# Patient Record
Sex: Female | Born: 2000 | Race: Black or African American | Hispanic: Yes | Marital: Single | Smoking: Never smoker
Health system: Southern US, Community
[De-identification: ages and names within clinical notes are randomized; demographics above are authoritative.]

## PROBLEM LIST (undated history)

## (undated) DIAGNOSIS — F419 Anxiety disorder, unspecified: Secondary | ICD-10-CM

## (undated) DIAGNOSIS — J45909 Unspecified asthma, uncomplicated: Secondary | ICD-10-CM

## (undated) DIAGNOSIS — Z789 Other specified health status: Secondary | ICD-10-CM

## (undated) DIAGNOSIS — Z7289 Other problems related to lifestyle: Secondary | ICD-10-CM

## (undated) DIAGNOSIS — F609 Personality disorder, unspecified: Secondary | ICD-10-CM

## (undated) HISTORY — DX: Personality disorder, unspecified: F60.9

## (undated) HISTORY — DX: Unspecified asthma, uncomplicated: J45.909

## (undated) HISTORY — PX: NO PAST SURGERIES: SHX2092

## (undated) HISTORY — DX: Anxiety disorder, unspecified: F41.9

---
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
## 2022-01-29 NOTE — L&D Delivery Note (Signed)
OB/GYN Faculty Practice Delivery Note  Stephanie Gamble is a 22 y.o. G2P1001 s/p SVD at [redacted]w[redacted]d. She was admitted for active labor.   ROM: 0h 69m with light/mod MSF fluid GBS Status: pos Maximum Maternal Temperature: 97.9  Labor Progress: Stephanie Gamble was admitted in early active labor; she got comfortable w an epidural and had the second dose of PCN hung as her membranes were ruptured for light/mod MSF. She felt the urge to push soon after and was complete, pushing with two contractions to SVD.  Delivery Date/Time: November 20th, 2024 at 2033 Delivery: Called to room and patient was complete and pushing. Head delivered ROA. No nuchal cord present. Shoulder and body delivered in usual fashion. Infant with spontaneous cry, placed on mother's abdomen, dried and stimulated. Cord clamped x 2 after 1-minute delay, and cut by FOB. Cord blood drawn. Placenta delivered spontaneously with gentle cord traction. Fundus firm with massage and Pitocin. Labia, perineum, vagina, and cervix inspected and found to have a L labial tear.   Placenta: spont, intact; to L&D Complications: none Lacerations: L labial, repaired with 4-0 Monocryl EBL: 501cc Analgesia: epidural  Postpartum Planning [x]  message to sent to schedule follow-up including request for routine Pap [x]  urine culture sent (TOC for recent UTI) - Wants inpatient Nexplanon placed prior to d/c  Infant: girl  APGARs 8/9  3062g (6lb 12oz)  Stephanie Gamble, CNM  12/19/2022 9:15 PM

## 2022-02-07 ENCOUNTER — Emergency Department (HOSPITAL_COMMUNITY): Payer: Medicaid Other

## 2022-02-07 ENCOUNTER — Emergency Department (HOSPITAL_COMMUNITY)
Admission: EM | Admit: 2022-02-07 | Discharge: 2022-02-08 | Disposition: A | Discharge status: Home / Self Care | Payer: Medicaid Other | Attending: Emergency Medicine | Admitting: Emergency Medicine

## 2022-02-07 ENCOUNTER — Other Ambulatory Visit: Payer: Self-pay

## 2022-02-07 ENCOUNTER — Encounter (HOSPITAL_COMMUNITY): Payer: Self-pay

## 2022-02-07 DIAGNOSIS — R1030 Lower abdominal pain, unspecified: Secondary | ICD-10-CM | POA: Diagnosis present

## 2022-02-07 DIAGNOSIS — Z20822 Contact with and (suspected) exposure to covid-19: Secondary | ICD-10-CM | POA: Diagnosis not present

## 2022-02-07 DIAGNOSIS — N9489 Other specified conditions associated with female genital organs and menstrual cycle: Secondary | ICD-10-CM | POA: Diagnosis not present

## 2022-02-07 DIAGNOSIS — N12 Tubulo-interstitial nephritis, not specified as acute or chronic: Secondary | ICD-10-CM | POA: Insufficient documentation

## 2022-02-07 DIAGNOSIS — J45909 Unspecified asthma, uncomplicated: Secondary | ICD-10-CM | POA: Insufficient documentation

## 2022-02-07 LAB — CBC WITH DIFFERENTIAL/PLATELET
Abs Immature Granulocytes: 0.04 10*3/uL (ref 0.00–0.07)
Basophils Absolute: 0 10*3/uL (ref 0.0–0.1)
Basophils Relative: 0 %
Eosinophils Absolute: 0.1 10*3/uL (ref 0.0–0.5)
Eosinophils Relative: 1 %
HCT: 40 % (ref 36.0–46.0)
Hemoglobin: 12.7 g/dL (ref 12.0–15.0)
Immature Granulocytes: 0 %
Lymphocytes Relative: 14 %
Lymphs Abs: 1.4 10*3/uL (ref 0.7–4.0)
MCH: 24.3 pg — ABNORMAL LOW (ref 26.0–34.0)
MCHC: 31.8 g/dL (ref 30.0–36.0)
MCV: 76.5 fL — ABNORMAL LOW (ref 80.0–100.0)
Monocytes Absolute: 0.8 10*3/uL (ref 0.1–1.0)
Monocytes Relative: 8 %
Neutro Abs: 7.7 10*3/uL (ref 1.7–7.7)
Neutrophils Relative %: 77 %
Platelets: 369 10*3/uL (ref 150–400)
RBC: 5.23 MIL/uL — ABNORMAL HIGH (ref 3.87–5.11)
RDW: 16.8 % — ABNORMAL HIGH (ref 11.5–15.5)
WBC: 10 10*3/uL (ref 4.0–10.5)
nRBC: 0 % (ref 0.0–0.2)

## 2022-02-07 LAB — URINALYSIS, ROUTINE W REFLEX MICROSCOPIC
Bilirubin Urine: NEGATIVE
Glucose, UA: NEGATIVE mg/dL
Hgb urine dipstick: NEGATIVE
Ketones, ur: NEGATIVE mg/dL
Nitrite: POSITIVE — AB
Protein, ur: NEGATIVE mg/dL
Specific Gravity, Urine: 1.017 (ref 1.005–1.030)
pH: 7 (ref 5.0–8.0)

## 2022-02-07 LAB — COMPREHENSIVE METABOLIC PANEL
ALT: 10 U/L (ref 0–44)
AST: 14 U/L — ABNORMAL LOW (ref 15–41)
Albumin: 3.6 g/dL (ref 3.5–5.0)
Alkaline Phosphatase: 48 U/L (ref 38–126)
Anion gap: 9 (ref 5–15)
BUN: 7 mg/dL (ref 6–20)
CO2: 22 mmol/L (ref 22–32)
Calcium: 8.6 mg/dL — ABNORMAL LOW (ref 8.9–10.3)
Chloride: 103 mmol/L (ref 98–111)
Creatinine, Ser: 0.77 mg/dL (ref 0.44–1.00)
GFR, Estimated: >60 mL/min (ref >60)
Glucose, Bld: 106 mg/dL — ABNORMAL HIGH (ref 70–99)
Potassium: 3.9 mmol/L (ref 3.5–5.1)
Sodium: 134 mmol/L — ABNORMAL LOW (ref 135–145)
Total Bilirubin: 0.3 mg/dL (ref 0.3–1.2)
Total Protein: 7.3 g/dL (ref 6.5–8.1)

## 2022-02-07 LAB — I-STAT BETA HCG BLOOD, ED (MC, WL, AP ONLY): I-stat hCG, quantitative: <5 m[IU]/mL (ref <5)

## 2022-02-07 LAB — RESP PANEL BY RT-PCR (RSV, FLU A&B, COVID)  RVPGX2
Influenza A by PCR: NEGATIVE
Influenza B by PCR: NEGATIVE
Resp Syncytial Virus by PCR: NEGATIVE
SARS Coronavirus 2 by RT PCR: NEGATIVE

## 2022-02-07 LAB — LIPASE, BLOOD: Lipase: 33 U/L (ref 11–51)

## 2022-02-07 MED ORDER — IOHEXOL 300 MG/ML  SOLN
100.0000 mL | Freq: Once | INTRAMUSCULAR | Status: AC | PRN
  Administered 2022-02-07: 100 mL via INTRAVENOUS

## 2022-02-07 NOTE — ED Provider Triage Note (Signed)
Emergency Medicine Provider Triage Evaluation Note  Stephanie Gamble , a 22 y.o. female  was evaluated in triage.  Pt complains of diffuse abdominal pain and cramping since after sexual intercourse 2 days ago.  She also reports vaginal spotting.  She reports feeling hot/chills.  No nausea, vomiting, bowel changes, chest pain or shortness of breath.  Review of Systems  Positive: As above Negative: As above  Physical Exam  BP 123/83 (BP Location: Left Arm)   Pulse (!) 110   Temp (!) 100.9 F (38.3 C) (Oral)   Resp 16   Ht 5\' 4"  (1.626 m)   Wt 63.5 kg   SpO2 99%   BMI 24.03 kg/m  Gen:   Awake, no distress   Resp:  Normal effort  MSK:   Moves extremities without difficulty  Other:  TTP to all 4 quadrants.  Medical Decision Making  Medically screening exam initiated at 9:38 PM.  Appropriate orders placed.  Merdis Spates was informed that the remainder of the evaluation will be completed by another provider, this initial triage assessment does not replace that evaluation, and the importance of remaining in the ED until their evaluation is complete.     Rex Kras, [place] 02/08/22 [phone]

## 2022-02-07 NOTE — ED Triage Notes (Signed)
Pt states that she needs an x ray or something. Pt states that she was having sex today and started spotting afterwards. Pt states that it hurts to move and breathe. Pt states that this has never happened before.

## 2022-02-08 ENCOUNTER — Telehealth (HOSPITAL_COMMUNITY): Payer: Self-pay

## 2022-02-08 LAB — GC/CHLAMYDIA PROBE AMP (~~LOC~~) NOT AT ARMC
Chlamydia: POSITIVE — AB
Comment: NEGATIVE
Comment: NORMAL
Neisseria Gonorrhea: NEGATIVE

## 2022-02-08 MED ORDER — DOXYCYCLINE HYCLATE 100 MG PO CAPS: 100.0000 mg | ORAL_CAPSULE | Freq: Two times a day (BID) | ORAL | 0 refills | Status: AC

## 2022-02-08 MED ORDER — ONDANSETRON 4 MG PO TBDP: 4.0000 mg | ORAL_TABLET | Freq: Three times a day (TID) | ORAL | 0 refills | Status: AC | PRN

## 2022-02-08 MED ORDER — CEPHALEXIN 500 MG PO CAPS: 500.0000 mg | ORAL_CAPSULE | Freq: Three times a day (TID) | ORAL | 0 refills | Status: AC

## 2022-02-08 NOTE — ED Notes (Addendum)
Keflex prescription called in to [facility]

## 2022-02-08 NOTE — ED Notes (Signed)
This RN called lab to add on G/C urine

## 2022-02-08 NOTE — ED Provider Notes (Signed)
[facility] DEPT Provider Note  CSN: [id number] Arrival date & time: 02/07/22 2032  Chief Complaint(s) Abdominal Pain  HPI Stephanie Gamble is a 22 y.o. female here for lower abdominal pain and abnormal vaginal bleeding that started 2 days ago following sexual intercourse.  Bleeding has resolved.  She denies any vaginal discharge.  States that she is sexually active with only 1 partner and is unprotected.  Pain is gradually worsening over that timeframe.  She denies any dysuria but does endorse frequency.  No nausea or vomiting.  No diarrhea.  No other physical complaints.   Abdominal Pain   Past Medical History Past Medical History:  Diagnosis Date   Anxiety    Asthma    Medical history non-contributory    Personality disorder 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 Authorizing Provider  acetaminophen (TYLENOL) 325 MG tablet Take 2 tablets (650 mg total) by mouth every 4 (four) hours as needed (for pain scale < 4). 04/04/19   Paula Compton, MD  famotidine (PEPCID) 20 MG tablet Take 1 tablet (20 mg total) by mouth daily. 02/14/20   Isla Pence, MD  ibuprofen (ADVIL) 800 MG tablet Take 1 tablet (800 mg total) by mouth 3 (three) times daily. 08/04/19   Wieters, Hallie C, PA-C  OXcarbazepine (TRILEPTAL) 150 MG tablet Take 1 tablet (150 mg total) by mouth 2 (two) times daily. 01/27/20   Connye Burkitt, NP  Prenatal Vit-Fe Fumarate-FA (PRENATAL MULTIVITAMIN) TABS tablet Take 1 tablet by mouth daily at 12 noon.    [provider]                                                                                                                                     Allergies Avocado, Banana, and Other  Review of Systems Review of Systems  Gastrointestinal:  Positive for abdominal pain.   As noted in HPI  Physical Exam Vital Signs  I have reviewed the triage vital signs BP 107/67   Pulse 92   Temp 98.2 F (36.8 C) (Oral)   Resp 18   Ht 5\' 4"  (1.626 m)   Wt 63.5 kg   SpO2 95%   BMI 24.03 kg/m   Physical Exam Vitals reviewed.  Constitutional:      General: She is not in acute distress.    Appearance: She is well-developed. She is not diaphoretic.  HENT:     Head: Normocephalic and atraumatic.     Right Ear: External ear normal.     Left Ear: External ear normal.  Nose: Nose normal.  Eyes:     General: No scleral icterus.    Conjunctiva/sclera: Conjunctivae normal.  Neck:     Trachea: Phonation normal.  Cardiovascular:     Rate and Rhythm: Normal rate and regular rhythm.  Pulmonary:     Effort: Pulmonary effort is normal. No respiratory distress.     Breath sounds: No stridor.  Abdominal:     General: There is no distension.     Tenderness: There is abdominal tenderness in the suprapubic area.  Genitourinary:    Comments: Refused pelvic exam Musculoskeletal:        General: Normal range of motion.     Cervical back: Normal range of motion.  Neurological:     Mental Status: She is alert and oriented to person, place, and time.  Psychiatric:        Behavior: Behavior normal.     ED Results and Treatments Labs (all labs ordered are listed, but only abnormal results are displayed) Labs Reviewed  CBC WITH DIFFERENTIAL/PLATELET - Abnormal; Notable for the following components:      Result Value   RBC 5.23 (*)    MCV 76.5 (*)    MCH 24.3 (*)    RDW 16.8 (*)    All other components within normal limits  COMPREHENSIVE METABOLIC PANEL - Abnormal; Notable for the following components:   Sodium 134 (*)    Glucose, Bld 106 (*)    Calcium 8.6 (*)     AST 14 (*)    All other components within normal limits  URINALYSIS, ROUTINE W REFLEX MICROSCOPIC - Abnormal; Notable for the following components:   Nitrite POSITIVE (*)    Leukocytes,Ua TRACE (*)    Bacteria, UA RARE (*)    All other components within normal limits  RESP PANEL BY RT-PCR (RSV, FLU A&B, COVID)  RVPGX2  LIPASE, BLOOD  I-STAT BETA HCG BLOOD, ED ([facility], [facility], [facility] ONLY)  GC/CHLAMYDIA PROBE AMP ([place]) NOT AT [facility]                                                                                                                         EKG  EKG Interpretation  Date/Time:    Ventricular Rate:    PR Interval:    QRS Duration:   QT Interval:    QTC Calculation:   R Axis:     Text Interpretation:         Radiology CT ABDOMEN PELVIS W CONTRAST  Result Date: 02/07/2022 CLINICAL DATA:  Diffuse abdominal pain. EXAM: CT ABDOMEN AND PELVIS WITH CONTRAST TECHNIQUE: Multidetector CT imaging of the abdomen and pelvis was performed using the standard protocol following bolus administration of intravenous contrast. RADIATION DOSE REDUCTION: This exam was performed according to the departmental dose-optimization program which includes automated exposure control, adjustment of the mA and/or kV according to patient size and/or use of iterative reconstruction technique. CONTRAST:  OMNIPAQUE IOHEXOL 300 MG/ML  SOLN COMPARISON:  Chest radiograph dated  February 14, 2020 FINDINGS: Lower chest: No acute abnormality. Hepatobiliary: No focal liver abnormality is seen. No gallstones, gallbladder wall thickening, or biliary dilatation. Pancreas: Unremarkable. No pancreatic ductal dilatation or surrounding inflammatory changes. Spleen: Normal in size without focal abnormality. Adrenals/Urinary Tract: Adrenal glands are unremarkable. Kidneys are normal, without renal calculi, focal lesion, or hydronephrosis. Bladder is unremarkable. Stomach/Bowel: Stomach is within normal limits. Appendix not  identified no inflammatory changes in the pericecal region. No evidence of bowel wall thickening, distention, or inflammatory changes. Vascular/Lymphatic: No significant vascular findings are present. No enlarged abdominal or pelvic lymph nodes. Reproductive: Uterus and bilateral adnexa are unremarkable. Other: No abdominal wall hernia or abnormality. No abdominopelvic ascites. Musculoskeletal: No acute or significant osseous findings. IMPRESSION: No CT evidence of acute abdominal/pelvic process. Electronically Signed   By: Larose Hires D.O.   On: 02/07/2022 23:02    Medications Ordered in ED Medications  iohexol (OMNIPAQUE) 300 MG/ML solution 100 mL (100 mLs Intravenous Contrast Given 02/07/22 2249)                                                                                                                                     Procedures Procedures  (including critical care time)  Medical Decision Making / ED Course   Medical Decision Making Amount and/or Complexity of Data Reviewed Labs: ordered. Decision-making details documented in ED Course. Radiology: ordered and independent interpretation performed. Decision-making details documented in ED Course.  Risk Prescription drug management.    Lower abdominal pain  Differential includes but not limited to pregnancy related process such as ectopic pregnancy, UTI, PID, appendicitis.  Patient adamantly refused to have a pelvic exam or even self swab for GC/chlamydia. UA chlam/GC sent.  CBC without leukocytosis or anemia Metabolic panel without significant electrolyte derangements or renal sufficiency.  No evidence of bili obstruction or pancreatitis. UA is suspicious for a urinary tract infection Beta-hCG negative  CT scan ordered in the MSE process was negative for any intra-abdominal inflammatory/infectious process.  Will treat patient for UTI/pyelonephritis. Will await results of GC/chlamydia urine sample.       Final  Clinical Impression(s) / ED Diagnoses Final diagnoses:  Pyelonephritis  Lower abdominal pain   The patient appears reasonably screened and/or stabilized for discharge and I doubt any other medical condition or other [facility] requiring further screening, evaluation, or treatment in the ED at this time. I have discussed the findings, Dx and Tx plan with the patient/family who expressed understanding and agree(s) with the plan. Discharge instructions discussed at length. The patient/family was given strict return precautions who verbalized understanding of the instructions. No further questions at time of discharge.  Disposition: Discharge  Condition: Good  ED Discharge Orders          Ordered    cephALEXin (KEFLEX) 500 MG capsule  3 times daily        02/08/22 0313    ondansetron (ZOFRAN-ODT) 4  MG disintegrating tablet  Every 8 hours PRN        02/08/22 0313             Follow Up: Pediatrics, [place] [place] [address] [phone]  Call  to schedule an appointment for close follow up            This chart was dictated using voice recognition software.  Despite best efforts to proofread,  errors can occur which can change the documentation meaning.    Fatima Blank, MD 02/08/22 [phone]

## 2022-08-07 IMAGING — US US ABDOMEN LIMITED
1 series · 14 of 25 positions shown · non-contrast
Comparison: None.

CLINICAL DATA: One day of right upper quadrant pain

EXAM:
ULTRASOUND ABDOMEN LIMITED RIGHT UPPER QUADRANT

[Series 1: us abdomen limited · 14 of 42 slices shown]
[im 1/42]
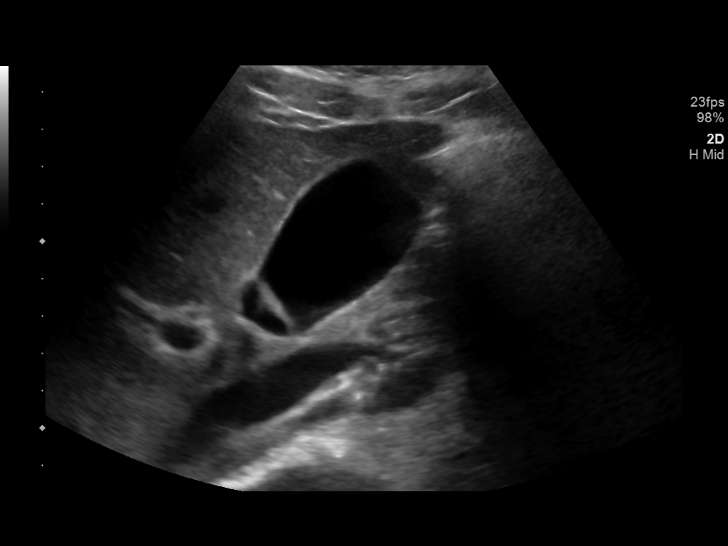
[im 4/42]
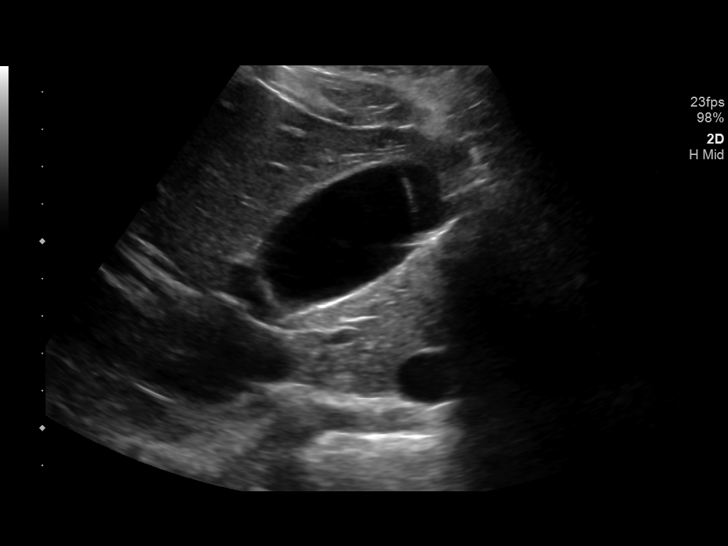
[im 7/42]
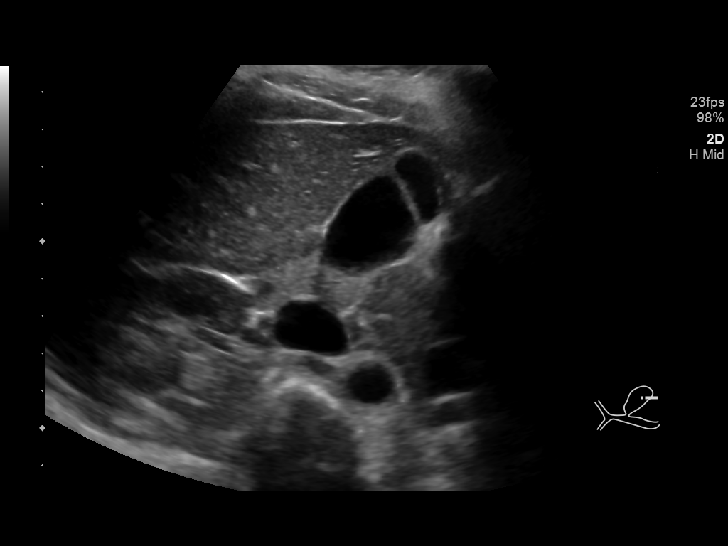
[im 11/42]
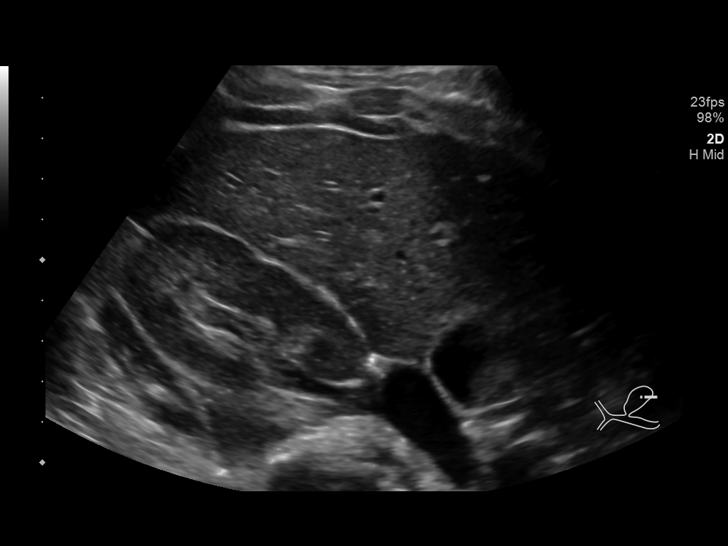
[im 14/42]
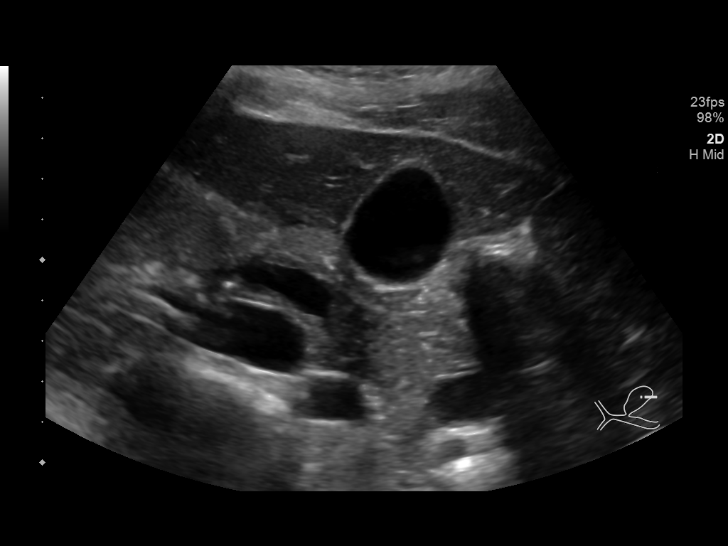
[im 16/42]
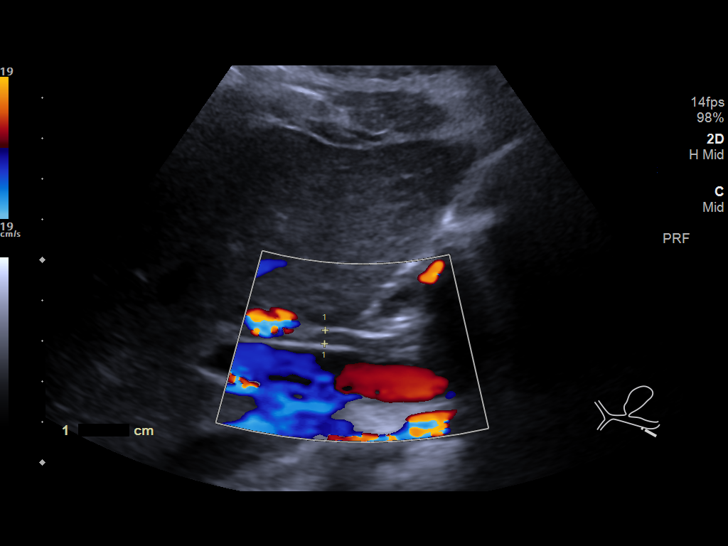
[im 19/42]
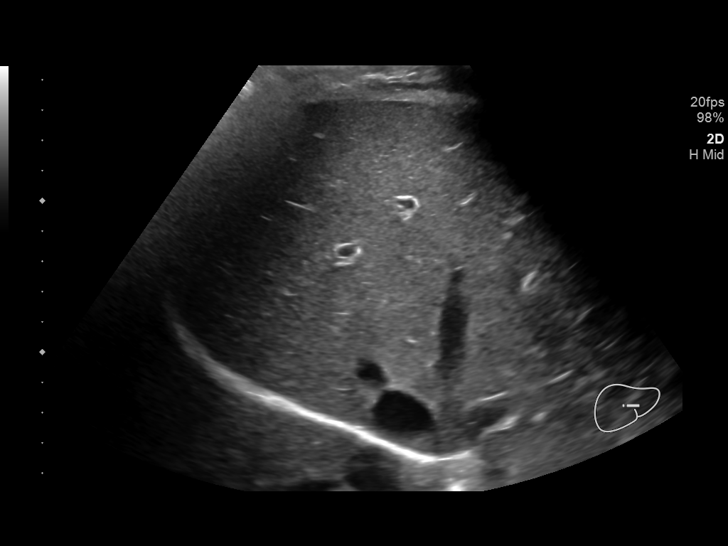
[im 23/42]
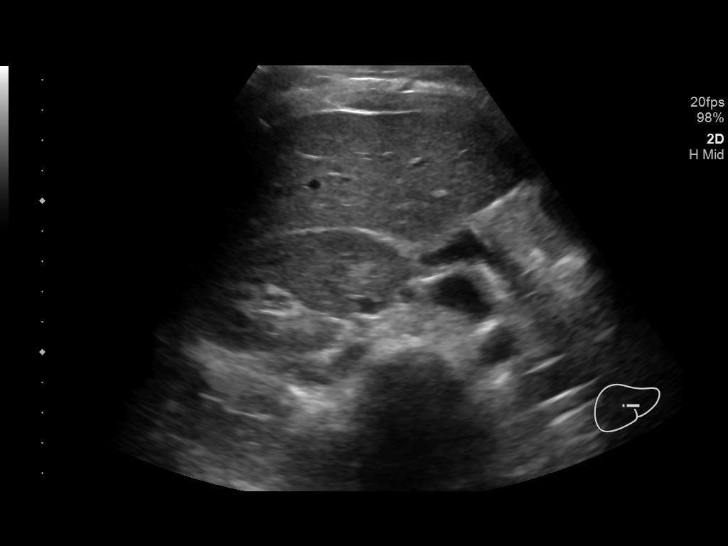
[im 26/42]
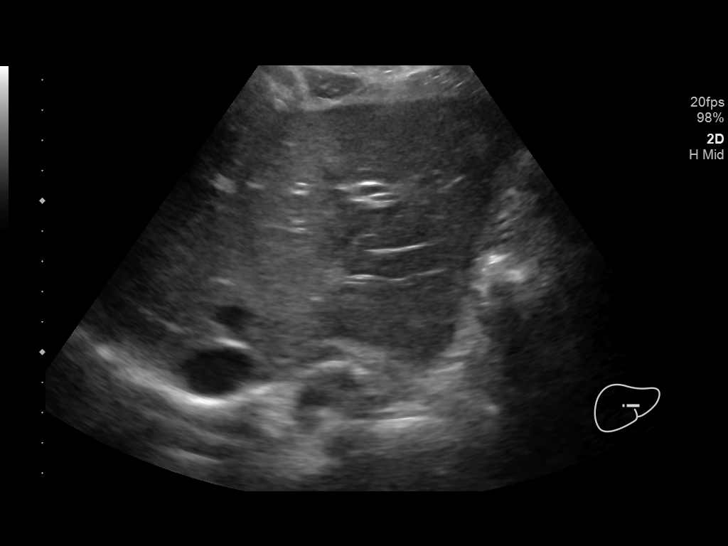
[im 28/42]
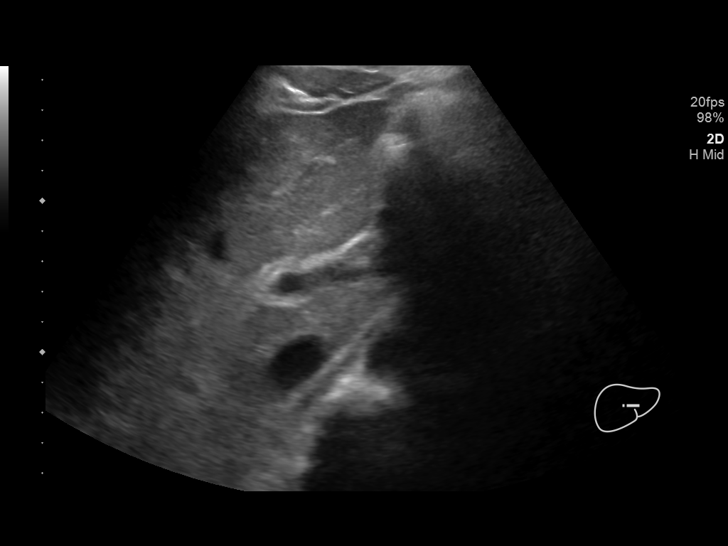
[im 31/42]
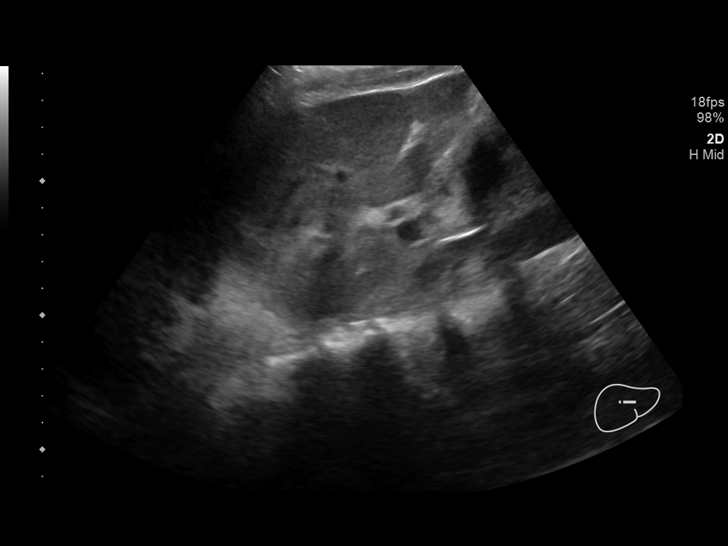
[im 35/42]
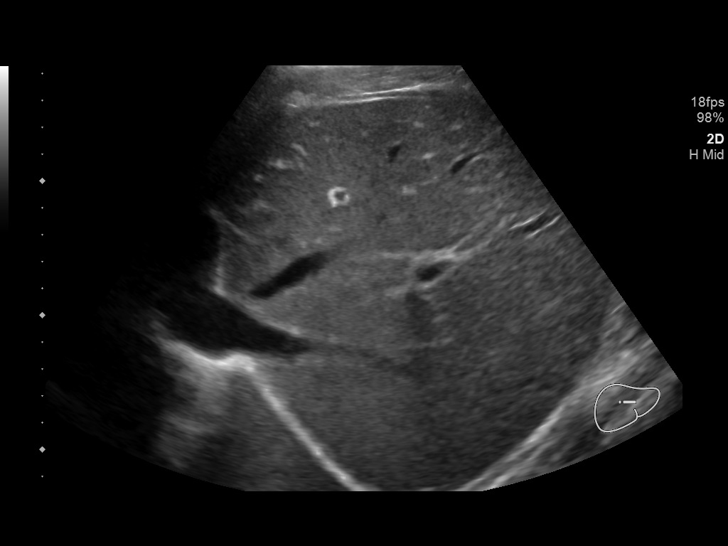
[im 38/42]
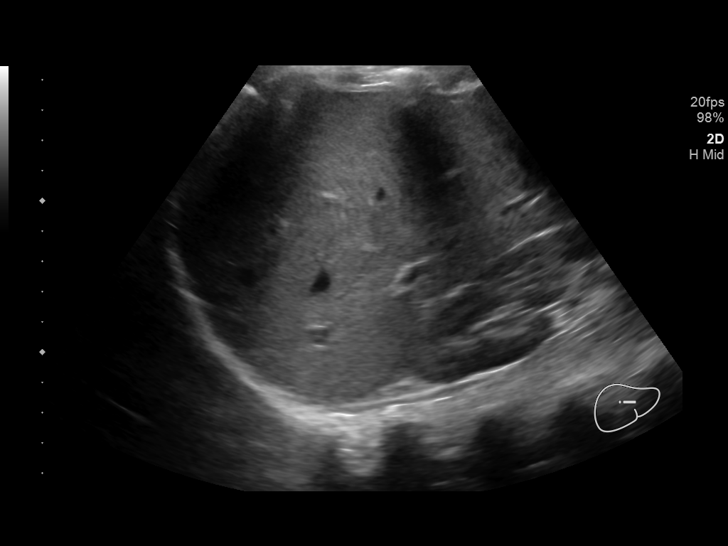
[im 42/42]
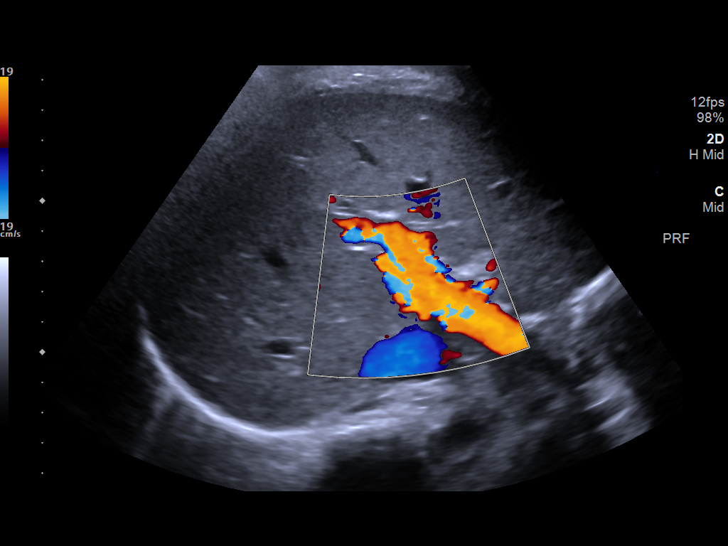

[14 of 25 positions shown; findings below may reference images not displayed]

FINDINGS: Gallbladder:

No gallstones or wall thickening visualized. No sonographic Murphy
sign noted by sonographer.

Common bile duct:

Diameter: 3.3 mm, nondilated

Liver:

No focal lesion identified. Within normal limits in parenchymal
echogenicity. Portal vein is patent on color Doppler imaging with
normal direction of blood flow towards the liver.

Other: None.
IMPRESSION: Unremarkable right upper quadrant ultrasound.

## 2022-09-04 ENCOUNTER — Encounter: Payer: Medicaid Other | Admitting: Medical

## 2022-09-04 ENCOUNTER — Telehealth: Payer: Self-pay

## 2022-09-04 NOTE — Telephone Encounter (Signed)
Patient left message with the after hours team to cancel her appointment. I called the patient and confirmed with her that I was cancelling her appointment.

## 2022-09-07 ENCOUNTER — Encounter (HOSPITAL_COMMUNITY): Payer: Self-pay | Admitting: Emergency Medicine

## 2022-09-07 ENCOUNTER — Ambulatory Visit (HOSPITAL_COMMUNITY)
Admission: EM | Admit: 2022-09-07 | Discharge: 2022-09-07 | Disposition: A | Discharge status: Home / Self Care | Payer: MEDICAID | Attending: Physician Assistant | Admitting: Physician Assistant

## 2022-09-07 DIAGNOSIS — U071 COVID-19: Secondary | ICD-10-CM

## 2022-09-07 DIAGNOSIS — R0602 Shortness of breath: Secondary | ICD-10-CM | POA: Diagnosis not present

## 2022-09-07 MED ORDER — ALBUTEROL SULFATE HFA 108 (90 BASE) MCG/ACT IN AERS: 1.0000 | INHALATION_SPRAY | Freq: Four times a day (QID) | RESPIRATORY_TRACT | 0 refills | Status: DC | PRN

## 2022-09-07 NOTE — Discharge Instructions (Signed)
We are treating you for COVID.  Continue using Tylenol, Mucinex, nasal saline, sinus rinses, humidifier, fluids.  You can use albuterol every 4-6 hours as needed for shortness of breath.  If you are having to use this multiple times a day please return for reevaluation.  If at any point you have worsening symptoms including worsening cough, shortness of breath, chest pain, nausea/vomiting, weakness you need to be seen immediately.

## 2022-09-07 NOTE — ED Provider Notes (Signed)
[facility]    CSN: [id number] Arrival date & time: 09/07/22  1916      History   Chief Complaint Chief Complaint  Patient presents with   Covid Positive   Letter for School/Work    HPI Stephanie Gamble is a 22 y.o. female.   Patient presents today with a 4-day history of URI symptoms.  She reports congestion, cough, shortness of breath, and bodyaches.  She denies any chest pain, nausea, vomiting, diarrhea.  She was experiencing a sore throat and headaches but these symptoms have improved.  She denies any known sick contacts.  She took an at home COVID test 2 days ago and this was positive.  She has not had COVID-19 vaccines.  She has been taking over-the-counter medications including Tylenol and flu medication.  She denies any significant past medical history including allergies, asthma, COPD.  She does not smoke.  Denies any recent antibiotics or steroids.  She is pregnant but is not sure exactly how far along she has.  She is scheduled to establish with primary care in the near future.    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 Authorizing Provider  albuterol (VENTOLIN HFA) 108 (90 Base) MCG/ACT inhaler Inhale 1-2 puffs  into the lungs every 6 (six) hours as needed for wheezing or shortness of breath. 09/07/22  Yes Taraann Olthoff, Noberto Retort, PA-C  acetaminophen (TYLENOL) 325 MG tablet Take 2 tablets (650 mg total) by mouth every 4 (four) hours as needed (for pain scale < 4). 04/04/19   Huel Cote, MD  Prenatal Vit-Fe Fumarate-FA (PRENATAL MULTIVITAMIN) TABS tablet Take 1 tablet by mouth daily at 12 noon.    [provider]    Family History Family History  Problem Relation Age of Onset   Healthy Mother    Hypertension Father     Social History Social History   Tobacco Use   Smoking status: Never   Smokeless tobacco: Never  Vaping Use   Vaping status: Never Used  Substance Use Topics   Alcohol use: Never   Drug use: Yes    Types: Marijuana    Comment: last used 10/2018     Allergies   Avocado, Banana, and Other   Review of Systems Review of Systems  Constitutional:  Positive for activity change. Negative for appetite change, fatigue and fever (Resolved).  HENT:  Positive for congestion. Negative for sinus pressure, sneezing and  sore throat (Resolved).   Respiratory:  Positive for cough and shortness of breath.   Cardiovascular:  Negative for chest pain.  Gastrointestinal:  Negative for abdominal pain, diarrhea, nausea and vomiting.  Musculoskeletal:  Positive for arthralgias and myalgias.  Neurological:  Negative for dizziness, light-headedness and headaches (Resolved).     Physical Exam Triage Vital Signs ED Triage Vitals  Encounter Vitals Group     BP 09/07/22 2011 103/69     Systolic BP Percentile --      Diastolic BP Percentile --      Pulse Rate 09/07/22 2011 69     Resp 09/07/22 2011 17     Temp 09/07/22 2011 97.8 F (36.6 C)     Temp Source 09/07/22 2011 Oral     SpO2 09/07/22 2011 100 %     Weight --      Height --      Head Circumference --      Peak Flow --      Pain Score 09/07/22 2012 4     Pain Loc --      Pain Education --      Exclude from Growth Chart --     No data found.  Updated Vital Signs BP 103/69 (BP Location: Left Arm)   Pulse 69   Temp 97.8 F (36.6 C) (Oral)   Resp 17   SpO2 100%   Visual Acuity Right Eye Distance:   Left Eye Distance:   Bilateral Distance:    Right Eye Near:   Left Eye Near:    Bilateral Near:     Physical Exam Vitals reviewed.  Constitutional:      General: She is awake. She is not in acute distress.    Appearance: Normal appearance. She is well-developed. She is not ill-appearing.     Comments: Very pleasant female appears stated age in no acute distress sitting comfortably in exam room  HENT:     Head: Normocephalic and atraumatic.     Right Ear: Tympanic membrane, ear canal and external ear normal. Tympanic membrane is not erythematous or bulging.     Left Ear: Tympanic membrane, ear canal and external ear normal. Tympanic membrane is not erythematous or bulging.     Nose:     Right Sinus: No maxillary sinus tenderness or frontal sinus tenderness.     Left Sinus: No maxillary sinus tenderness or frontal sinus tenderness.     Mouth/Throat:     Pharynx: Uvula midline. No oropharyngeal exudate or posterior oropharyngeal erythema.  Cardiovascular:     Rate and Rhythm: Normal rate and regular rhythm.     Heart sounds: Normal heart sounds, S1 normal and S2 normal. No murmur heard. Pulmonary:     Effort: Pulmonary effort is normal.     Breath sounds: Normal breath sounds. No wheezing, rhonchi or rales.     Comments: Clear to auscultation bilaterally Psychiatric:        Behavior: Behavior is cooperative.      UC Treatments / Results  Labs (all labs ordered are listed, but only abnormal results are displayed) Labs Reviewed - No data to display  EKG   Radiology No results found.  Procedures Procedures (including critical care time)  Medications Ordered in UC Medications - No data to display  Initial Impression / Assessment and Plan / UC Course  I have reviewed the triage vital  signs and the nursing notes.  Pertinent labs & imaging results that were available during my care  of the patient were reviewed by me and considered in my medical decision making (see chart for details).     Patient is well-appearing, afebrile, nontoxic, nontachycardic.  No evidence of acute infection on physical exam that would warrant initiation of antibiotics.  She has already had a positive COVID test at home and we discussed that this is the likely cause of her symptoms.  She is young and otherwise healthy is not a candidate for antiviral therapy.  Given she is pregnant she is limited in the medications that can be used.  Recommended Mucinex, Tylenol, nasal saline/sinus rinses, humidifier, pushing fluids.  She does report intermittent shortness of breath and so was given albuterol inhaler to use as needed but we discussed that if she is using this regularly (more than once per day) she should return for reevaluation.  Discussed that if her symptoms or not improving within a week she should return.  If anything worsen she needs to be seen immediately.  Strict return precautions given.  Work excuse note provided.  Final Clinical Impressions(s) / UC Diagnoses   Final diagnoses:  COVID-19  Shortness of breath     Discharge Instructions      We are treating you for COVID.  Continue using Tylenol, Mucinex, nasal saline, sinus rinses, humidifier, fluids.  You can use albuterol every 4-6 hours as needed for shortness of breath.  If you are having to use this multiple times a day please return for reevaluation.  If at any point you have worsening symptoms including worsening cough, shortness of breath, chest pain, nausea/vomiting, weakness you need to be seen immediately.    ED Prescriptions     Medication Sig Dispense Auth. Provider   albuterol (VENTOLIN HFA) 108 (90 Base) MCG/ACT inhaler Inhale 1-2 puffs into the lungs every 6 (six) hours as needed for wheezing or shortness of breath. 8 g  Elnor Renovato, Noberto Retort, PA-C      PDMP not reviewed this encounter.   Jeani Hawking, PA-C 09/07/22 2045

## 2022-09-07 NOTE — ED Triage Notes (Signed)
Pt reports that she started having body aches, cough, nasal congestion, headache, fatigue and nausea since Tuesday. Tested positive for Covid on Wed. Pt needing note for work. Took Flu medications and tylenol

## 2022-10-18 ENCOUNTER — Ambulatory Visit (INDEPENDENT_AMBULATORY_CARE_PROVIDER_SITE_OTHER): Payer: MEDICAID | Admitting: Family Medicine

## 2022-10-18 ENCOUNTER — Other Ambulatory Visit (HOSPITAL_COMMUNITY)
Admission: RE | Admit: 2022-10-18 | Discharge: 2022-10-18 | Disposition: A | Discharge status: Home / Self Care | Payer: MEDICAID | Source: Ambulatory Visit | Attending: Medical | Admitting: Medical

## 2022-10-18 ENCOUNTER — Encounter: Payer: Self-pay | Admitting: Family Medicine

## 2022-10-18 VITALS — BP 101/66 | HR 101 | Wt 160.0 lb

## 2022-10-18 DIAGNOSIS — O0933 Supervision of pregnancy with insufficient antenatal care, third trimester: Secondary | ICD-10-CM | POA: Diagnosis not present

## 2022-10-18 DIAGNOSIS — O0993 Supervision of high risk pregnancy, unspecified, third trimester: Secondary | ICD-10-CM

## 2022-10-18 DIAGNOSIS — Z1331 Encounter for screening for depression: Secondary | ICD-10-CM

## 2022-10-18 DIAGNOSIS — Z3A37 37 weeks gestation of pregnancy: Secondary | ICD-10-CM | POA: Insufficient documentation

## 2022-10-18 DIAGNOSIS — O0990 Supervision of high risk pregnancy, unspecified, unspecified trimester: Secondary | ICD-10-CM | POA: Insufficient documentation

## 2022-10-18 DIAGNOSIS — O99820 Streptococcus B carrier state complicating pregnancy: Secondary | ICD-10-CM

## 2022-10-18 DIAGNOSIS — Z1339 Encounter for screening examination for other mental health and behavioral disorders: Secondary | ICD-10-CM | POA: Diagnosis not present

## 2022-10-18 DIAGNOSIS — O0930 Supervision of pregnancy with insufficient antenatal care, unspecified trimester: Secondary | ICD-10-CM | POA: Insufficient documentation

## 2022-10-18 NOTE — Progress Notes (Signed)
Subjective:  Stephanie Gamble is a G2P1001 [redacted]w[redacted]d being seen today for her first obstetrical visit.  Her obstetrical history is significant for  late to prenatal care. She has been taking care of her sister's kids and hasn't had time to come to appointments . Her first pregnancy ended in a SVD and was uncomplicated. Patient does intend to breast feed. Pregnancy history fully reviewed.  Patient reports no complaints.  BP 101/66   Pulse (!) 101   Wt 160 lb (72.6 kg)   BMI 27.46 kg/m   HISTORY: OB History  Gravida Para Term Preterm AB Living  2 1 1     1   SAB IAB Ectopic Multiple Live Births        0 1    # Outcome Date GA Lbr Len/2nd Weight Sex Type Anes PTL Lv  2 Current           1 Term 04/02/19 [redacted]w[redacted]d 06:40 / 01:23 6 lb 12.5 oz (3.076 kg) M Vag-Vacuum EPI  LIV    Past Medical History:  Diagnosis Date   Anxiety    Asthma    Medical history non-contributory    Personality disorder (HCC)    Self mutilating behavior     Past Surgical History:  Procedure Laterality Date   NO PAST SURGERIES      Family History  Problem Relation Age of Onset   Healthy Mother    Hypertension Father      Exam  BP 101/66   Pulse (!) 101   Wt 160 lb (72.6 kg)   BMI 27.46 kg/m   Chaperone present during exam  CONSTITUTIONAL: Well-developed, well-nourished female in no acute distress.  HENT:  Normocephalic, atraumatic, External right and left ear normal. Oropharynx is clear and moist EYES: Conjunctivae and EOM are normal. Pupils are equal, round, and reactive to light. No scleral icterus.  NECK: Normal range of motion, supple, no masses.  Normal thyroid.  CARDIOVASCULAR: Normal heart rate noted, regular rhythm RESPIRATORY: Clear to auscultation bilaterally. Effort and breath sounds normal, no problems with respiration noted. BREASTS: Symmetric in size. No masses, skin changes, nipple drainage, or lymphadenopathy. ABDOMEN: Soft, normal bowel sounds, no distention noted.  No tenderness,  rebound or guarding.  PELVIC: Normal appearing external genitalia;  MUSCULOSKELETAL: Normal range of motion. No tenderness.  No cyanosis, clubbing, or edema.  2+ distal pulses. SKIN: Skin is warm and dry. No rash noted. Not diaphoretic. No erythema. No pallor. NEUROLOGIC: Alert and oriented to person, place, and time. Normal reflexes, muscle tone coordination. No cranial nerve deficit noted. PSYCHIATRIC: Normal mood and affect. Normal behavior. Normal judgment and thought content.    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 [redacted] weeks gestation of pregnancy - CBC/D/Plt+RPR+Rh+ABO+RubIgG... - Culture, beta strep (group b only) - GC/Chlamydia probe amp ([place])not at [facility] - Culture, OB Urine - [place] MFM OB DETAIL +14 WK; Future - Glucose tolerance, 1 hour  2. Supervision of high risk pregnancy, antepartum FHT and FH normal - CBC/D/Plt+RPR+Rh+ABO+RubIgG... - Culture, beta strep (group b only) - GC/Chlamydia probe amp ([place])not at [facility] - Culture, OB Urine -  [place] MFM OB DETAIL +14 WK; Future - Glucose tolerance, 1 hour  3. Late prenatal care affecting pregnancy, antepartum Will return for 1hr GTT - CBC/D/Plt+RPR+Rh+ABO+RubIgG... - Culture, beta strep (group b only) - GC/Chlamydia probe amp ([place])not at [facility] - Culture, OB Urine - [place] MFM OB DETAIL +14 WK; Future - Glucose tolerance, 1 hour    Initial labs obtained Continue prenatal vitamins Reviewed n/v relief measures and warning s/s to report Reviewed recommended weight gain based on pre-gravid BMI Encouraged well-balanced  diet CCNC completed> form faxed if has or is planning to apply for medicaid The nature of [place] [facility] [employer] with multiple MDs and other Advanced Practice Providers was explained to patient; also emphasized that fellows, residents, and students are part of our team.  Problem list reviewed and updated. 75% of 30 min visit spent on counseling and coordination of care.     Levie Heritage 10/18/2022

## 2022-10-19 ENCOUNTER — Ambulatory Visit (INDEPENDENT_AMBULATORY_CARE_PROVIDER_SITE_OTHER): Payer: MEDICAID

## 2022-10-19 DIAGNOSIS — Z3A37 37 weeks gestation of pregnancy: Secondary | ICD-10-CM

## 2022-10-19 DIAGNOSIS — Z3483 Encounter for supervision of other normal pregnancy, third trimester: Secondary | ICD-10-CM

## 2022-10-19 LAB — GC/CHLAMYDIA PROBE AMP (~~LOC~~) NOT AT ARMC
Chlamydia: NEGATIVE
Comment: NEGATIVE
Comment: NORMAL
Neisseria Gonorrhea: NEGATIVE

## 2022-10-19 NOTE — Progress Notes (Signed)
Patient presents for New OB labs and 1 hr GTT. Patient was sent to the lab for GTT and labs.  Stephanie Gamble l Slevin Gunby, CMA

## 2022-10-20 LAB — CBC/D/PLT+RPR+RH+ABO+RUBIGG...
Antibody Screen: NEGATIVE
Basophils Absolute: 0 10*3/uL (ref 0.0–0.2)
Basos: 1 %
EOS (ABSOLUTE): 0.1 10*3/uL (ref 0.0–0.4)
Eos: 1 %
HCV Ab: NONREACTIVE
HIV Screen 4th Generation wRfx: NONREACTIVE
Hematocrit: 30 % — ABNORMAL LOW (ref 34.0–46.6)
Hemoglobin: 9.4 g/dL — ABNORMAL LOW (ref 11.1–15.9)
Hepatitis B Surface Ag: NEGATIVE
Immature Grans (Abs): 0 10*3/uL (ref 0.0–0.1)
Immature Granulocytes: 0 %
Lymphocytes Absolute: 2 10*3/uL (ref 0.7–3.1)
Lymphs: 30 %
MCH: 23.8 pg — ABNORMAL LOW (ref 26.6–33.0)
MCHC: 31.3 g/dL — ABNORMAL LOW (ref 31.5–35.7)
MCV: 76 fL — ABNORMAL LOW (ref 79–97)
Monocytes Absolute: 0.5 10*3/uL (ref 0.1–0.9)
Monocytes: 7 %
Neutrophils Absolute: 4.1 10*3/uL (ref 1.4–7.0)
Neutrophils: 61 %
Platelets: 291 10*3/uL (ref 150–450)
RBC: 3.95 x10E6/uL (ref 3.77–5.28)
RDW: 16.6 % — ABNORMAL HIGH (ref 11.7–15.4)
RPR Ser Ql: NONREACTIVE
Rh Factor: POSITIVE
Rubella Antibodies, IGG: 1.85 index (ref >0.99)
WBC: 6.8 10*3/uL (ref 3.4–10.8)

## 2022-10-20 LAB — GLUCOSE TOLERANCE, 1 HOUR: Glucose, 1Hr PP: 109 mg/dL (ref 70–199)

## 2022-10-20 LAB — HCV INTERPRETATION

## 2022-10-21 LAB — URINE CULTURE, OB REFLEX

## 2022-10-21 LAB — CULTURE, BETA STREP (GROUP B ONLY): Strep Gp B Culture: POSITIVE — AB

## 2022-10-21 LAB — CULTURE, OB URINE

## 2022-10-22 DIAGNOSIS — O99820 Streptococcus B carrier state complicating pregnancy: Secondary | ICD-10-CM | POA: Insufficient documentation

## 2022-10-22 MED ORDER — CEFADROXIL 500 MG PO CAPS: 500.0000 mg | ORAL_CAPSULE | Freq: Two times a day (BID) | ORAL | 0 refills | Status: DC

## 2022-10-22 NOTE — Addendum Note (Signed)
Addended by: Levie Heritage on: 10/22/2022 08:38 AM   Modules accepted: Orders

## 2022-10-24 NOTE — BH Specialist Note (Signed)
Pt did not arrive to video visit and did not answer the phone; Left HIPPA-compliant message to call back Aryahna Spagna from [facility]  [facility] at  [phone] (Ilir Mahrt's office).  ?; left MyChart message for patient.  ? ?

## 2022-10-26 ENCOUNTER — Ambulatory Visit (INDEPENDENT_AMBULATORY_CARE_PROVIDER_SITE_OTHER): Payer: MEDICAID | Admitting: Obstetrics and Gynecology

## 2022-10-26 VITALS — BP 105/62 | HR 81 | Wt 162.0 lb

## 2022-10-26 DIAGNOSIS — Z3A38 38 weeks gestation of pregnancy: Secondary | ICD-10-CM

## 2022-10-26 DIAGNOSIS — O0930 Supervision of pregnancy with insufficient antenatal care, unspecified trimester: Secondary | ICD-10-CM

## 2022-10-26 DIAGNOSIS — O0990 Supervision of high risk pregnancy, unspecified, unspecified trimester: Secondary | ICD-10-CM

## 2022-10-26 NOTE — Progress Notes (Addendum)
   PRENATAL VISIT NOTE  Subjective:  Stephanie Gamble is a 22 y.o. G2P1001 at [redacted]w[redacted]d being seen today for ongoing prenatal care.  She is currently monitored for the following issues for this low-risk pregnancy and has Self-injurious behavior; Depression with suicidal ideation; MDD (major depressive disorder), recurrent severe, without psychosis (HCC); [redacted] weeks gestation of pregnancy; Late prenatal care affecting pregnancy; Supervision of high risk pregnancy, antepartum; and GBS (group B Streptococcus carrier), +RV culture, currently pregnant on their problem list.  Patient reports no complaints.  Contractions: Not present. Vag. Bleeding: None.  Movement: Present. Denies leaking of fluid.   The following portions of the patient's history were reviewed and updated as appropriate: allergies, current medications, past family history, past medical history, past social history, past surgical history and problem list.   Objective:   Vitals:   10/26/22 0929  BP: 105/62  Pulse: 81  Weight: 162 lb (73.5 kg)    Fetal Status:     Movement: Present     General:  Alert, oriented and cooperative. Patient is in no acute distress.  Skin: Skin is warm and dry. No rash noted.   Cardiovascular: Normal heart rate noted  Respiratory: Normal respiratory effort, no problems with respiration noted  Abdomen: Soft, gravid,  multiple healed scars across abdomen  Pain/Pressure: Absent     Pelvic: Cervical exam deferred        Extremities: Normal range of motion.  Edema: None  Mental Status: Normal mood and affect. Normal behavior. Normal judgment and thought content.   Assessment and Plan:  Pregnancy: G2P1001 at [redacted]w[redacted]d 1. [redacted] weeks gestation of pregnancy FHR wnl  2. Supervision of high risk pregnancy, antepartum All questions answered Did not complete ABX for UTI - encouraged to pick up rx and complete course due to risk of pyelonephritis in pregnancy   3. Late prenatal care affecting pregnancy,  antepartum Will move [place] as appears to be measuring small and currently 38 weeks  Term labor symptoms and general obstetric precautions including but not limited to vaginal bleeding, contractions, leaking of fluid and fetal movement were reviewed in detail with the patient. Please refer to After Visit Summary for other counseling recommendations.   No follow-ups on file.  Future Appointments  Date Time Provider Department Center  10/29/2022  8:35 AM Milas Hock, MD [facility] None  11/01/2022  8:15 AM [facility] NURSE [facility]  11/01/2022  8:30 AM [facility] US2 [facility]  11/06/2022  9:15 AM [facility]  11/07/2022  9:55 AM Magnus Sinning, Dimas Alexandria, PA-C [facility] None    Lorriane Shire, MD

## 2022-10-29 ENCOUNTER — Encounter: Payer: MEDICAID | Admitting: Obstetrics and Gynecology

## 2022-11-01 ENCOUNTER — Encounter: Payer: Self-pay | Admitting: *Deleted

## 2022-11-01 ENCOUNTER — Ambulatory Visit: Payer: MEDICAID | Admitting: *Deleted

## 2022-11-01 ENCOUNTER — Ambulatory Visit: Payer: MEDICAID | Attending: Family Medicine

## 2022-11-01 ENCOUNTER — Other Ambulatory Visit: Payer: Self-pay | Admitting: *Deleted

## 2022-11-01 VITALS — BP 108/58 | HR 73

## 2022-11-01 DIAGNOSIS — Z3A37 37 weeks gestation of pregnancy: Secondary | ICD-10-CM | POA: Insufficient documentation

## 2022-11-01 DIAGNOSIS — O0930 Supervision of pregnancy with insufficient antenatal care, unspecified trimester: Secondary | ICD-10-CM | POA: Diagnosis not present

## 2022-11-01 DIAGNOSIS — O0990 Supervision of high risk pregnancy, unspecified, unspecified trimester: Secondary | ICD-10-CM | POA: Insufficient documentation

## 2022-11-01 DIAGNOSIS — O0933 Supervision of pregnancy with insufficient antenatal care, third trimester: Secondary | ICD-10-CM

## 2022-11-06 ENCOUNTER — Ambulatory Visit: Payer: Self-pay | Admitting: Clinical

## 2022-11-06 DIAGNOSIS — Z91199 Patient's noncompliance with other medical treatment and regimen due to unspecified reason: Secondary | ICD-10-CM

## 2022-11-07 ENCOUNTER — Encounter: Payer: MEDICAID | Admitting: Medical

## 2022-11-08 ENCOUNTER — Encounter: Payer: MEDICAID | Admitting: Obstetrics & Gynecology

## 2022-11-20 ENCOUNTER — Ambulatory Visit (INDEPENDENT_AMBULATORY_CARE_PROVIDER_SITE_OTHER): Payer: MEDICAID

## 2022-11-20 VITALS — BP 112/63 | HR 82 | Wt 164.0 lb

## 2022-11-20 DIAGNOSIS — O0990 Supervision of high risk pregnancy, unspecified, unspecified trimester: Secondary | ICD-10-CM

## 2022-11-20 DIAGNOSIS — O99820 Streptococcus B carrier state complicating pregnancy: Secondary | ICD-10-CM

## 2022-11-20 DIAGNOSIS — Z3A35 35 weeks gestation of pregnancy: Secondary | ICD-10-CM

## 2022-11-20 NOTE — Progress Notes (Signed)
   LOW-RISK PREGNANCY OFFICE VISIT  Patient name: Stephanie Gamble MRN [id number]  Date of birth: [birth year] Chief Complaint:   No chief complaint on file.  Subjective:   Stephanie Gamble is a 22 y.o. G28P1001 female at [redacted]w[redacted]d with an Estimated Date of Delivery: 12/19/22 being seen today for ongoing management of a low-risk pregnancy aeb has Self-injurious behavior; Depression with suicidal ideation; MDD (major depressive disorder), recurrent severe, without psychosis (HCC); [redacted] weeks gestation of pregnancy; Late prenatal care affecting pregnancy; Supervision of high risk pregnancy, antepartum; and GBS (group B Streptococcus carrier), +RV culture, currently pregnant on their problem list.  Patient presents today, with son , with no complaints.  Patient endorses fetal movement. Patient denies abdominal cramping.  Patient denies vaginal concerns including abnormal discharge, leaking of fluid, and bleeding. No issues with urination, constipation, or diarrhea.   Patient reports that she has contractions in the morning that lasts a couple of hours.  She denies having more than 6/hr.  She reports drinking about 3 cups of water daily.    Contractions: Irritability. Vag. Bleeding: None.  Movement: Present.  Reviewed past medical,surgical, social, obstetrical and family history as well as problem list, medications and allergies.  Objective   Vitals:   11/20/22 1113 11/20/22 1117  BP: (!) 119/91 112/63  Pulse: 96 82  Weight: 164 lb (74.4 kg)   Body mass index is 28.15 kg/m.  Total Weight Gain:34 lb (15.4 kg)        Physical Examination:   General appearance: Well appearing, and in no distress  Mental status: Alert, oriented to person, place, and time  Skin: Warm & dry  Cardiovascular: Normal heart rate noted  Respiratory: Normal respiratory effort, no distress  Abdomen: Soft, gravid, nontender, AGA with    Pelvic: Cervical exam deferred           Extremities: Edema: None  Fetal Status:  Fetal Heart Rate (bpm): 154  Movement: Present   No results found for this or any previous visit (from the past 24 hour(s)).  Assessment & Plan:  Low-risk pregnancy of a 22 y.o., G2P1001 at [redacted]w[redacted]d with an Estimated Date of Delivery: 12/19/22   1. Supervision of high risk pregnancy, antepartum -Anticipatory guidance for upcoming appts. -Patient to schedule next appt in 2 weeks for an in-person visit. -Patient requesting 2 week appt d/t transportation coordination.   2. [redacted] weeks gestation of pregnancy -Doing well. -Discussed increased water intake.  -Reports completion of UTI medication. -Will send for TOC today.   3. GBS (group B Streptococcus carrier), +RV culture, currently pregnant -Will obtain GC/CT from [facility] today.      Meds: No orders of the defined types were placed in this encounter.  Labs/procedures today:  Lab Orders  No laboratory test(s) ordered today     Reviewed: Preterm labor symptoms and general obstetric precautions including but not limited to vaginal bleeding, contractions, leaking of fluid and fetal movement were reviewed in detail with the patient.  All questions were answered.  Follow-up: No follow-ups on file.  No orders of the defined types were placed in this encounter.  Cherre Robins MSN, CNM 11/20/2022   Addendum 12:05 PM -Patient unable to leave urine specimen. -States she can not return d/t transportation issues.  -Will plan to collect at next appt.   Cherre Robins MSN, CNM Advanced Practice Provider, [facility] [employer]

## 2022-11-29 ENCOUNTER — Ambulatory Visit: Payer: MEDICAID

## 2022-12-06 ENCOUNTER — Ambulatory Visit: Payer: MEDICAID | Attending: Maternal & Fetal Medicine

## 2022-12-06 ENCOUNTER — Other Ambulatory Visit: Payer: Self-pay

## 2022-12-06 ENCOUNTER — Ambulatory Visit: Payer: MEDICAID | Admitting: *Deleted

## 2022-12-06 VITALS — BP 114/66 | HR 87

## 2022-12-06 DIAGNOSIS — Z3A38 38 weeks gestation of pregnancy: Secondary | ICD-10-CM

## 2022-12-06 DIAGNOSIS — O0990 Supervision of high risk pregnancy, unspecified, unspecified trimester: Secondary | ICD-10-CM

## 2022-12-06 DIAGNOSIS — O0933 Supervision of pregnancy with insufficient antenatal care, third trimester: Secondary | ICD-10-CM

## 2022-12-06 DIAGNOSIS — O26843 Uterine size-date discrepancy, third trimester: Secondary | ICD-10-CM | POA: Diagnosis not present

## 2022-12-07 ENCOUNTER — Ambulatory Visit (INDEPENDENT_AMBULATORY_CARE_PROVIDER_SITE_OTHER): Payer: MEDICAID | Admitting: Obstetrics and Gynecology

## 2022-12-07 VITALS — BP 106/63 | HR 87 | Wt 168.0 lb

## 2022-12-07 DIAGNOSIS — N390 Urinary tract infection, site not specified: Secondary | ICD-10-CM

## 2022-12-07 DIAGNOSIS — D508 Other iron deficiency anemias: Secondary | ICD-10-CM

## 2022-12-07 DIAGNOSIS — D649 Anemia, unspecified: Secondary | ICD-10-CM | POA: Insufficient documentation

## 2022-12-07 DIAGNOSIS — O99820 Streptococcus B carrier state complicating pregnancy: Secondary | ICD-10-CM

## 2022-12-07 DIAGNOSIS — F332 Major depressive disorder, recurrent severe without psychotic features: Secondary | ICD-10-CM

## 2022-12-07 DIAGNOSIS — Z3A38 38 weeks gestation of pregnancy: Secondary | ICD-10-CM

## 2022-12-07 DIAGNOSIS — O0990 Supervision of high risk pregnancy, unspecified, unspecified trimester: Secondary | ICD-10-CM

## 2022-12-07 MED ORDER — ESCITALOPRAM OXALATE 10 MG PO TABS: ORAL_TABLET | ORAL | 11 refills | Status: AC

## 2022-12-07 NOTE — Patient Instructions (Addendum)
Stephanie Gamble is a virtual mental health platform available to our patients   We can refer you to a local mental health provider or you can refer yourself to this online platform using the link below  [email or web address]   Safe Medications in Pregnancy - Hemorrhoids:  Anusol  Anusol HC  Preparation H  Tucks

## 2022-12-07 NOTE — Progress Notes (Signed)
   PRENATAL VISIT NOTE  Subjective:  Stephanie Gamble is a 22 y.o. G2P1001 at [redacted]w[redacted]d being seen today for ongoing prenatal care.  She is currently monitored for the following issues for this high-risk pregnancy and has Self-injurious behavior; Depression with suicidal ideation; MDD (major depressive disorder), recurrent severe, without psychosis (HCC); Late prenatal care affecting pregnancy; Supervision of high risk pregnancy, antepartum; and GBS (group B Streptococcus carrier), +RV culture, currently pregnant on their problem list.  Patient reports hemorrhoids and low mood. No SI.  Contractions: Irritability. Vag. Bleeding: None.  Movement: Present. Denies leaking of fluid.   The following portions of the patient's history were reviewed and updated as appropriate: allergies, current medications, past family history, past medical history, past social history, past surgical history and problem list.   Objective:   Vitals:   12/07/22 1048  BP: 106/63  Pulse: 87  Weight: 168 lb (76.2 kg)    Fetal Status: Fetal Heart Rate (bpm): 142   Movement: Present     General:  Alert, oriented and cooperative. Patient is in no acute distress.  Skin: Skin is warm and dry. No rash noted.   Cardiovascular: Normal heart rate noted  Respiratory: Normal respiratory effort, no problems with respiration noted  Abdomen: Soft, gravid, appropriate for gestational age.  Pain/Pressure: Present      Assessment and Plan:  Pregnancy: G2P1001 at [redacted]w[redacted]d 1. Supervision of high risk pregnancy, antepartum 2. [redacted] weeks gestation of pregnancy Growth [place] yesterday AGA Pap PP Declined flu shot, will readdress tdap  3. Urinary tract infection without hematuria, site unspecified TOC ordered, pt left before leaving a sample - Urine Culture  4. MDD (major depressive disorder), recurrent severe, without psychosis (HCC) Reports worsening mood, no SI. Previously on lexapro, vistaril prn, and ?seroquel prior to pregnancy but  stopped w/ pregnancy. Discussed r/b of restarting medications. Pt agreeable to restarting lexapro. [place] information provided, referral to IBH placed - Ambulatory referral to Integrated Behavioral [facility] -     escitalopram (LEXAPRO) 10 MG tablet; Take 0.5 tablets (5 mg total) by mouth daily for 7 days, THEN 1 tablet (10 mg total) daily.  5. GBS (group B Streptococcus carrier), +RV culture, currently pregnant PCN in labor  6. Anemia Po iron  Term labor symptoms and general obstetric precautions including but not limited to vaginal bleeding, contractions, leaking of fluid and fetal movement were reviewed in detail with the patient. Please refer to After Visit Summary for other counseling recommendations.   Return in about 1 week (around 12/14/2022) for return OB at 39 weeks.  Future Appointments  Date Time Provider Department Center  12/13/2022  9:55 AM Levie Heritage, DO [facility] None  12/19/2022 10:15 AM Adrian Blackwater, Rhona Raider, DO [facility] None    Lennart Pall, MD

## 2022-12-13 ENCOUNTER — Encounter: Payer: MEDICAID | Admitting: Family Medicine

## 2022-12-19 ENCOUNTER — Encounter (HOSPITAL_COMMUNITY): Payer: Self-pay | Admitting: Obstetrics and Gynecology

## 2022-12-19 ENCOUNTER — Other Ambulatory Visit: Payer: Self-pay

## 2022-12-19 ENCOUNTER — Inpatient Hospital Stay (HOSPITAL_COMMUNITY): Payer: MEDICAID | Admitting: Anesthesiology

## 2022-12-19 ENCOUNTER — Telehealth: Payer: Self-pay

## 2022-12-19 ENCOUNTER — Telehealth (INDEPENDENT_AMBULATORY_CARE_PROVIDER_SITE_OTHER): Payer: MEDICAID | Admitting: Family Medicine

## 2022-12-19 ENCOUNTER — Inpatient Hospital Stay (HOSPITAL_COMMUNITY)
Admission: AD | Admit: 2022-12-19 | Discharge: 2022-12-21 | DRG: 806 | Disposition: A | Discharge status: Home / Self Care | Payer: MEDICAID | Attending: Obstetrics & Gynecology | Admitting: Obstetrics & Gynecology

## 2022-12-19 DIAGNOSIS — O0930 Supervision of pregnancy with insufficient antenatal care, unspecified trimester: Secondary | ICD-10-CM

## 2022-12-19 DIAGNOSIS — F1290 Cannabis use, unspecified, uncomplicated: Secondary | ICD-10-CM | POA: Diagnosis present

## 2022-12-19 DIAGNOSIS — O99344 Other mental disorders complicating childbirth: Secondary | ICD-10-CM | POA: Diagnosis present

## 2022-12-19 DIAGNOSIS — O99820 Streptococcus B carrier state complicating pregnancy: Secondary | ICD-10-CM

## 2022-12-19 DIAGNOSIS — Z8249 Family history of ischemic heart disease and other diseases of the circulatory system: Secondary | ICD-10-CM

## 2022-12-19 DIAGNOSIS — O480 Post-term pregnancy: Secondary | ICD-10-CM

## 2022-12-19 DIAGNOSIS — F17290 Nicotine dependence, other tobacco product, uncomplicated: Secondary | ICD-10-CM | POA: Diagnosis present

## 2022-12-19 DIAGNOSIS — O9902 Anemia complicating childbirth: Secondary | ICD-10-CM | POA: Diagnosis present

## 2022-12-19 DIAGNOSIS — F332 Major depressive disorder, recurrent severe without psychotic features: Secondary | ICD-10-CM | POA: Diagnosis present

## 2022-12-19 DIAGNOSIS — O99214 Obesity complicating childbirth: Secondary | ICD-10-CM | POA: Diagnosis present

## 2022-12-19 DIAGNOSIS — Z3A40 40 weeks gestation of pregnancy: Secondary | ICD-10-CM | POA: Diagnosis not present

## 2022-12-19 DIAGNOSIS — O99334 Smoking (tobacco) complicating childbirth: Secondary | ICD-10-CM | POA: Diagnosis present

## 2022-12-19 DIAGNOSIS — F319 Bipolar disorder, unspecified: Secondary | ICD-10-CM | POA: Diagnosis not present

## 2022-12-19 DIAGNOSIS — F1490 Cocaine use, unspecified, uncomplicated: Secondary | ICD-10-CM | POA: Diagnosis present

## 2022-12-19 DIAGNOSIS — Z30017 Encounter for initial prescription of implantable subdermal contraceptive: Secondary | ICD-10-CM | POA: Diagnosis not present

## 2022-12-19 DIAGNOSIS — O0990 Supervision of high risk pregnancy, unspecified, unspecified trimester: Secondary | ICD-10-CM

## 2022-12-19 DIAGNOSIS — O99824 Streptococcus B carrier state complicating childbirth: Secondary | ICD-10-CM | POA: Diagnosis present

## 2022-12-19 DIAGNOSIS — Z79899 Other long term (current) drug therapy: Secondary | ICD-10-CM | POA: Diagnosis not present

## 2022-12-19 DIAGNOSIS — F330 Major depressive disorder, recurrent, mild: Secondary | ICD-10-CM | POA: Diagnosis present

## 2022-12-19 DIAGNOSIS — O0933 Supervision of pregnancy with insufficient antenatal care, third trimester: Principal | ICD-10-CM

## 2022-12-19 DIAGNOSIS — O99324 Drug use complicating childbirth: Secondary | ICD-10-CM | POA: Diagnosis present

## 2022-12-19 DIAGNOSIS — D649 Anemia, unspecified: Secondary | ICD-10-CM | POA: Diagnosis present

## 2022-12-19 DIAGNOSIS — O26893 Other specified pregnancy related conditions, third trimester: Secondary | ICD-10-CM | POA: Diagnosis present

## 2022-12-19 LAB — TYPE AND SCREEN
ABO/RH(D): O POS
Antibody Screen: NEGATIVE

## 2022-12-19 LAB — CBC
HCT: 32.7 % — ABNORMAL LOW (ref 36.0–46.0)
Hemoglobin: 10.1 g/dL — ABNORMAL LOW (ref 12.0–15.0)
MCH: 21.6 pg — ABNORMAL LOW (ref 26.0–34.0)
MCHC: 30.9 g/dL (ref 30.0–36.0)
MCV: 69.9 fL — ABNORMAL LOW (ref 80.0–100.0)
Platelets: 305 10*3/uL (ref 150–400)
RBC: 4.68 MIL/uL (ref 3.87–5.11)
RDW: 18.9 % — ABNORMAL HIGH (ref 11.5–15.5)
WBC: 8 10*3/uL (ref 4.0–10.5)
nRBC: 0 % (ref 0.0–0.2)

## 2022-12-19 MED ORDER — ACETAMINOPHEN 325 MG PO TABS: 650.0000 mg | ORAL_TABLET | ORAL | Status: DC | PRN

## 2022-12-19 MED ORDER — METHYLERGONOVINE MALEATE 0.2 MG/ML IJ SOLN
0.2000 mg | Freq: Once | INTRAMUSCULAR | Status: AC
  Administered 2022-12-19: 0.2 mg via INTRAMUSCULAR

## 2022-12-19 MED ORDER — PRENATAL MULTIVITAMIN CH
1.0000 | ORAL_TABLET | Freq: Every day | ORAL | Status: DC
  Administered 2022-12-20 – 2022-12-21 (×2): 1 via ORAL
  Filled 2022-12-19 (×2): qty 1

## 2022-12-19 MED ORDER — DIPHENHYDRAMINE HCL 25 MG PO CAPS: 25.0000 mg | ORAL_CAPSULE | Freq: Four times a day (QID) | ORAL | Status: DC | PRN

## 2022-12-19 MED ORDER — SOD CITRATE-CITRIC ACID 500-334 MG/5ML PO SOLN: 30.0000 mL | ORAL | Status: DC | PRN

## 2022-12-19 MED ORDER — ACETAMINOPHEN 325 MG PO TABS
650.0000 mg | ORAL_TABLET | ORAL | Status: DC | PRN
  Administered 2022-12-20 – 2022-12-21 (×4): 650 mg via ORAL
  Filled 2022-12-19 (×4): qty 2

## 2022-12-19 MED ORDER — BUPIVACAINE HCL (PF) 0.25 % IJ SOLN
INTRAMUSCULAR | Status: DC | PRN
  Administered 2022-12-19: 5 mL via EPIDURAL

## 2022-12-19 MED ORDER — ONDANSETRON HCL 4 MG/2ML IJ SOLN: 4.0000 mg | INTRAMUSCULAR | Status: DC | PRN

## 2022-12-19 MED ORDER — LACTATED RINGERS IV SOLN: 500.0000 mL | INTRAVENOUS | Status: DC | PRN

## 2022-12-19 MED ORDER — DIBUCAINE (PERIANAL) 1 % EX OINT
1.0000 | TOPICAL_OINTMENT | CUTANEOUS | Status: DC | PRN
Start: 2022-12-19 — End: 2022-12-22

## 2022-12-19 MED ORDER — ONDANSETRON HCL 4 MG/2ML IJ SOLN
4.0000 mg | Freq: Four times a day (QID) | INTRAMUSCULAR | Status: DC | PRN
  Filled 2022-12-19: qty 2

## 2022-12-19 MED ORDER — LACTATED RINGERS IV SOLN: INTRAVENOUS | Status: DC

## 2022-12-19 MED ORDER — OXYCODONE-ACETAMINOPHEN 5-325 MG PO TABS: 1.0000 | ORAL_TABLET | ORAL | Status: DC | PRN

## 2022-12-19 MED ORDER — LIDOCAINE HCL (PF) 1 % IJ SOLN: INTRAMUSCULAR | Status: DC | PRN

## 2022-12-19 MED ORDER — OXYTOCIN BOLUS FROM INFUSION
333.0000 mL | Freq: Once | INTRAVENOUS | Status: AC
  Administered 2022-12-19: 333 mL via INTRAVENOUS

## 2022-12-19 MED ORDER — SIMETHICONE 80 MG PO CHEW: 80.0000 mg | CHEWABLE_TABLET | ORAL | Status: DC | PRN

## 2022-12-19 MED ORDER — FENTANYL-BUPIVACAINE-NACL 0.5-0.125-0.9 MG/250ML-% EP SOLN
12.0000 mL/h | EPIDURAL | Status: DC | PRN
  Administered 2022-12-19: 12 mL/h via EPIDURAL
  Filled 2022-12-19: qty 250

## 2022-12-19 MED ORDER — MEASLES, MUMPS & RUBELLA VAC IJ SOLR: 0.5000 mL | Freq: Once | INTRAMUSCULAR | Status: DC

## 2022-12-19 MED ORDER — IBUPROFEN 600 MG PO TABS
600.0000 mg | ORAL_TABLET | Freq: Four times a day (QID) | ORAL | Status: DC
  Administered 2022-12-19 – 2022-12-21 (×8): 600 mg via ORAL
  Filled 2022-12-19 (×8): qty 1

## 2022-12-19 MED ORDER — MISOPROSTOL 200 MCG PO TABS
ORAL_TABLET | ORAL | Status: AC
  Filled 2022-12-19: qty 2

## 2022-12-19 MED ORDER — PHENYLEPHRINE 80 MCG/ML (10ML) SYRINGE FOR IV PUSH (FOR BLOOD PRESSURE SUPPORT)
80.0000 ug | PREFILLED_SYRINGE | INTRAVENOUS | Status: DC | PRN
  Administered 2022-12-19: 80 ug via INTRAVENOUS

## 2022-12-19 MED ORDER — ESCITALOPRAM OXALATE 10 MG PO TABS
10.0000 mg | ORAL_TABLET | Freq: Every day | ORAL | Status: DC
  Administered 2022-12-20 – 2022-12-21 (×2): 10 mg via ORAL
  Filled 2022-12-19 (×2): qty 1

## 2022-12-19 MED ORDER — ONDANSETRON HCL 4 MG PO TABS: 4.0000 mg | ORAL_TABLET | ORAL | Status: DC | PRN

## 2022-12-19 MED ORDER — MISOPROSTOL 200 MCG PO TABS
400.0000 ug | ORAL_TABLET | Freq: Once | ORAL | Status: AC
  Administered 2022-12-19: 400 ug via ORAL

## 2022-12-19 MED ORDER — EPHEDRINE 5 MG/ML INJ: 10.0000 mg | INTRAVENOUS | Status: DC | PRN

## 2022-12-19 MED ORDER — METHYLERGONOVINE MALEATE 0.2 MG PO TABS: 0.2000 mg | ORAL_TABLET | Freq: Once | ORAL | Status: AC

## 2022-12-19 MED ORDER — ZOLPIDEM TARTRATE 5 MG PO TABS: 5.0000 mg | ORAL_TABLET | Freq: Every evening | ORAL | Status: DC | PRN

## 2022-12-19 MED ORDER — PENICILLIN G POT IN DEXTROSE 60000 UNIT/ML IV SOLN
3.0000 10*6.[IU] | INTRAVENOUS | Status: DC
  Administered 2022-12-19: 3 10*6.[IU] via INTRAVENOUS
  Filled 2022-12-19: qty 50

## 2022-12-19 MED ORDER — LACTATED RINGERS IV SOLN
500.0000 mL | Freq: Once | INTRAVENOUS | Status: AC
  Administered 2022-12-19: 500 mL via INTRAVENOUS

## 2022-12-19 MED ORDER — OXYCODONE HCL 5 MG PO TABS
5.0000 mg | ORAL_TABLET | ORAL | Status: DC | PRN
  Administered 2022-12-20 – 2022-12-21 (×3): 5 mg via ORAL
  Filled 2022-12-19 (×4): qty 1

## 2022-12-19 MED ORDER — COCONUT OIL OIL: 1.0000 | TOPICAL_OIL | Status: DC | PRN

## 2022-12-19 MED ORDER — SENNOSIDES-DOCUSATE SODIUM 8.6-50 MG PO TABS
2.0000 | ORAL_TABLET | ORAL | Status: DC
  Administered 2022-12-19 – 2022-12-20 (×2): 2 via ORAL
  Filled 2022-12-19 (×2): qty 2

## 2022-12-19 MED ORDER — LIDOCAINE HCL (PF) 1 % IJ SOLN
INTRAMUSCULAR | Status: DC | PRN
  Administered 2022-12-19: 10 mL via EPIDURAL
  Administered 2022-12-19: 5 mL via EPIDURAL

## 2022-12-19 MED ORDER — OXYTOCIN-SODIUM CHLORIDE 30-0.9 UT/500ML-% IV SOLN
2.5000 [IU]/h | INTRAVENOUS | Status: DC
Start: 2022-12-19 — End: 2022-12-19
  Filled 2022-12-19: qty 500

## 2022-12-19 MED ORDER — BENZOCAINE-MENTHOL 20-0.5 % EX AERO
1.0000 | INHALATION_SPRAY | CUTANEOUS | Status: DC | PRN
Start: 2022-12-19 — End: 2022-12-22

## 2022-12-19 MED ORDER — METHYLERGONOVINE MALEATE 0.2 MG/ML IJ SOLN
INTRAMUSCULAR | Status: AC
  Filled 2022-12-19: qty 1

## 2022-12-19 MED ORDER — WITCH HAZEL-GLYCERIN EX PADS
1.0000 | MEDICATED_PAD | CUTANEOUS | Status: DC | PRN
Start: 2022-12-19 — End: 2022-12-22

## 2022-12-19 MED ORDER — DIPHENHYDRAMINE HCL 50 MG/ML IJ SOLN
12.5000 mg | INTRAMUSCULAR | Status: DC | PRN
Start: 2022-12-19 — End: 2022-12-19

## 2022-12-19 MED ORDER — OXYCODONE-ACETAMINOPHEN 5-325 MG PO TABS: 2.0000 | ORAL_TABLET | ORAL | Status: DC | PRN

## 2022-12-19 MED ORDER — PHENYLEPHRINE 80 MCG/ML (10ML) SYRINGE FOR IV PUSH (FOR BLOOD PRESSURE SUPPORT)
80.0000 ug | PREFILLED_SYRINGE | INTRAVENOUS | Status: DC | PRN
  Filled 2022-12-19: qty 10

## 2022-12-19 MED ORDER — SODIUM CHLORIDE 0.9 % IV SOLN
5.0000 10*6.[IU] | Freq: Once | INTRAVENOUS | Status: AC
  Administered 2022-12-19: 5 10*6.[IU] via INTRAVENOUS
  Filled 2022-12-19: qty 5

## 2022-12-19 MED ORDER — TETANUS-DIPHTH-ACELL PERTUSSIS 5-2.5-18.5 LF-MCG/0.5 IM SUSY: 0.5000 mL | PREFILLED_SYRINGE | Freq: Once | INTRAMUSCULAR | Status: DC

## 2022-12-19 MED ORDER — LIDOCAINE HCL (PF) 1 % IJ SOLN: 30.0000 mL | INTRAMUSCULAR | Status: DC | PRN

## 2022-12-19 NOTE — Telephone Encounter (Signed)
Patient called stating she believes she is in active labor and is questioning what she should do. Advised patient she should [facility]. Patient states contractions are every few minutes.

## 2022-12-19 NOTE — MAU Provider Note (Signed)
None      S: Ms. Stephanie Gamble is a 22 y.o. G2P1001 at [redacted]w[redacted]d  who presents to MAU today complaining contractions q 2-3 minutes since 1200. She denies vaginal bleeding. She denies LOF. She reports normal fetal movement.    O: BP 108/64 (BP Location: Right Arm)   Pulse 99   Temp (!) 97.5 F (36.4 C) (Oral)   Resp 18   LMP 03/21/2022   SpO2 100%  GENERAL: Well-developed, well-nourished female in no acute distress.  HEAD: Normocephalic, atraumatic.  CHEST: Normal effort of breathing, regular heart rate ABDOMEN: Soft, nontender, gravid  Cervical exam:  Dilation: 5 Effacement (%): 90 Presentation: Vertex Exam by:: S. Warren-Hill, CNM   Fetal Monitoring: Baseline: 150 Variability: mod Accelerations: 15x15 Decelerations: absent Contractions: 2-3, mod-strong   A: SIUP at [redacted]w[redacted]d  Active labor  P: Admit   Richardson Landry, CNM 12/19/2022 3:44 PM

## 2022-12-19 NOTE — H&P (Signed)
OBSTETRIC ADMISSION HISTORY AND PHYSICAL  Stephanie Gamble is a 22 y.o. female G2P1001 with IUP at [redacted]w[redacted]d by [place] presenting for SOL. She reports +FMs, No LOF, no VB, no blurry vision, headaches or peripheral edema, and RUQ pain.  She plans on breast feeding. She request Nexplanon  for birth control. She received her prenatal care at  [facility]    Dating: By ultrasound on 11/28/2022 --->  Estimated Date of Delivery: 12/19/22  Sono:    @38  w1d, CWD, normal anatomy, cephalic presentation, posterior placenta, 3094 g, 34% EFW   Prenatal History/Complications: Late prenatal care, anemia, MDD  Past Medical History: Past Medical History:  Diagnosis Date   Anxiety    Asthma    Medical history non-contributory    Personality disorder (HCC)    Self mutilating behavior     Past Surgical History: Past Surgical History:  Procedure Laterality Date   NO PAST SURGERIES      Obstetrical History: OB History     Gravida  2   Para  1   Term  1   Preterm      AB      Living  1      SAB      IAB      Ectopic      Multiple  0   Live Births  1           Social History Social History   Socioeconomic History   Marital status: Single    Spouse name: Not on file   Number of children: Not on file   Years of education: Not on file   Highest education level: Not on file  Occupational History   Not on file  Tobacco Use   Smoking status: Never   Smokeless tobacco: Never  Vaping Use   Vaping status: Every Day  Substance and Sexual Activity   Alcohol use: Never   Drug use: Yes    Types: Marijuana    Comment: last used 10/2018   Sexual activity: Not Currently    Birth control/protection: None  Other Topics Concern   Not on file  Social History Narrative   Not on file   Social Determinants of Health   Financial Resource Strain: Not on file  Food Insecurity: Not on file  Transportation Needs: Not on file  Physical Activity: Not on file  Stress: Not on file  Social  Connections: Not on file    Family History: Family History  Problem Relation Age of Onset   Healthy Mother    Hypertension Father     Allergies: Allergies  Allergen Reactions   Avocado Anaphylaxis   Banana Anaphylaxis   Other Anaphylaxis    Most fruits    Medications Prior to Admission  Medication Sig Dispense Refill Last Dose   escitalopram (LEXAPRO) 10 MG tablet Take 0.5 tablets (5 mg total) by mouth daily for 7 days, THEN 1 tablet (10 mg total) daily. 30 tablet 11 12/18/2022   Prenatal Vit-Fe Fumarate-FA (PRENATAL MULTIVITAMIN) TABS tablet Take 1 tablet by mouth daily at 12 noon. (Patient not taking: Reported on 11/20/2022)        Review of Systems   All systems reviewed and negative except as stated in HPI  Blood pressure 108/64, pulse 99, temperature (!) 97.5 F (36.4 C), temperature source Oral, resp. rate 18, last menstrual period 03/21/2022, SpO2 100%. General appearance: alert, cooperative, and appears stated age Lungs: clear to auscultation bilaterally Heart: regular rate and rhythm Abdomen: soft,  non-tender; bowel sounds normal Presentation: cephalic Fetal monitoringBaseline: 140 bpm, Variability: Good {> 6 bpm), Accelerations: Reactive, and Decelerations: Absent Uterine activityNone Dilation: 5 Effacement (%): 90 Exam by:: Stephanie Gamble, CNM   Prenatal labs: ABO, Rh: O/Positive/-- (09/20 0910) Antibody: Negative (09/20 0910) Rubella: 1.85 (09/20 0910) RPR: Non Reactive (09/20 0910)  HBsAg: Negative (09/20 0910)  HIV: Non Reactive (09/20 0910)  GBS: Positive/-- (09/19 1357)  1 hr Glucola Normal Genetic screening not done Anatomy US normal but technically difficult  Prenatal Transfer Tool  Maternal Diabetes: No Genetic Screening: Declined Maternal Ultrasounds/Referrals: Normal Fetal Ultrasounds or other Referrals:  None Maternal Substance Abuse:  No Significant Maternal Medications:  Meds include: Other: Lexapro Significant Maternal Lab  Results:  Group B Strep positive Number of Prenatal Visits:greater than 3 verified prenatal visits Other Comments:   Late to prenatal care  No results found for this or any previous visit (from the past 24 hour(s)).  Patient Active Problem List   Diagnosis Date Noted   Indication for care in labor or delivery 12/19/2022   Anemia 12/07/2022   GBS (group B Streptococcus carrier), +RV culture, currently pregnant 10/22/2022   Late prenatal care affecting pregnancy 10/18/2022   Supervision of high risk pregnancy, antepartum 10/18/2022   Self-injurious behavior 05/13/2018   MDD (major depressive disorder), recurrent severe, without psychosis (HCC) 05/13/2018    Assessment/Plan:  Stephanie Gamble is a 34 y.o. G2P1001 at [redacted]w[redacted]d here for SOL  #Labor: Patient presenting with spontaneous onset of labor.  Will monitor while patient receives antibiotics.  Once adequately treated for GBS prophylaxis consider AROM #Pain: Per patient request #FWB: Category 1 #ID:  GBS positive, penicillin given #MOF: Breast #MOC: Nexplanon    Stephanie Savage, MD  12/19/2022, 3:39 PM

## 2022-12-19 NOTE — Anesthesia Procedure Notes (Signed)
Epidural Patient location during procedure: OB Start time: 12/19/2022 5:05 PM End time: 12/19/2022 5:10 PM  Staffing Anesthesiologist: Leilani Able, MD Performed: anesthesiologist   Preanesthetic Checklist Completed: patient identified, IV checked, site marked, risks and benefits discussed, surgical consent, monitors and equipment checked, pre-op evaluation and timeout performed  Epidural Patient position: sitting Prep: DuraPrep and site prepped and draped Patient monitoring: continuous pulse ox and blood pressure Approach: midline Location: L3-L4 Injection technique: LOR air  Needle:  Needle type: Tuohy  Needle gauge: 17 G Needle length: 9 cm and 9 Needle insertion depth: 6 cm Catheter type: closed end flexible Catheter size: 19 Gauge Catheter at skin depth: 11 cm Test dose: negative and Other  Assessment Events: blood not aspirated, no cerebrospinal fluid, injection not painful, no injection resistance, no paresthesia and negative IV test

## 2022-12-19 NOTE — Progress Notes (Signed)
Patient ID: Stephanie Gamble, female   DOB: 2000-04-08, 22 y.o.   MRN: [id number]   Comfortable w epidural; just hanging second dose of PCN  VSS, afebrile FHR 140s, +accels, no decels, occ mi variables Ctx q 3-4 mins Cx 9/90/vtx -1; AROM for light/mod MSF  IUP@40 .0wks GBS+ Active labor/transition  Anticipate her becoming complete in the next couple of hours  Arabella Merles CNM 12/19/2022 8:22 PM

## 2022-12-19 NOTE — Anesthesia Preprocedure Evaluation (Signed)
Anesthesia Evaluation  Patient identified by MRN, date of birth, ID band Patient awake    Reviewed: Allergy & Precautions, NPO status , Patient's Chart, lab work & pertinent test results  Airway Mallampati: II       Dental no notable dental hx.    Pulmonary asthma    Pulmonary exam normal        Cardiovascular negative cardio ROS Normal cardiovascular exam     Neuro/Psych  PSYCHIATRIC DISORDERS Anxiety Depression    negative neurological ROS     GI/Hepatic negative GI ROS, Neg liver ROS,,,  Endo/Other  Obesity BMI 33  Renal/GU negative Renal ROS  negative genitourinary   Musculoskeletal negative musculoskeletal ROS (+)    Abdominal Normal abdominal exam  (+)   Peds negative pediatric ROS (+)  Hematology  (+) Blood dyscrasia, anemia   Anesthesia Other Findings   Reproductive/Obstetrics (+) Pregnancy                             Anesthesia Physical Anesthesia Plan  ASA: II  Anesthesia Plan: Epidural   Post-op Pain Management:    Induction:   PONV Risk Score and Plan:   Airway Management Planned:   Additional Equipment:   Intra-op Plan:   Post-operative Plan:   Informed Consent: I have reviewed the patients History and Physical, chart, labs and discussed the procedure including the risks, benefits and alternatives for the proposed anesthesia with the patient or authorized representative who has indicated his/her understanding and acceptance.       Plan Discussed with:   Anesthesia Plan Comments:         Anesthesia Quick Evaluation

## 2022-12-19 NOTE — Progress Notes (Signed)
    TELEHEALTH OBSTETRICS VISIT ENCOUNTER NOTE  Provider location: Center for [facility]   Patient location: Home  I connected with Stephanie Gamble on 12/19/22 at 10:15 AM EST by telephone at home and verified that I am speaking with the correct person using two identifiers. Of note, unable to do video encounter due to technical difficulties.    I discussed the limitations, risks, security and privacy concerns of performing an evaluation and management service by telephone and the availability of in person appointments. I also discussed with the patient that there may be a patient responsible charge related to this service. The patient expressed understanding and agreed to proceed.  Subjective:  Stephanie Gamble is a 22 y.o. G2P1001 at [redacted]w[redacted]d being followed for ongoing prenatal care.  She is currently monitored for the following issues for this high-risk pregnancy and has Self-injurious behavior; MDD (major depressive disorder), recurrent severe, without psychosis (HCC); Late prenatal care affecting pregnancy; Supervision of high risk pregnancy, antepartum; GBS (group B Streptococcus carrier), +RV culture, currently pregnant; Anemia; and Indication for care in labor or delivery on their problem list.  Patient reports no complaints. Reports fetal movement. Denies any contractions, bleeding or leaking of fluid.   The following portions of the patient's history were reviewed and updated as appropriate: allergies, current medications, past family history, past medical history, past social history, past surgical history and problem list.   Objective:  Last menstrual period 03/21/2022. General:  Alert, oriented and cooperative.   Mental Status: Normal mood and affect perceived. Normal judgment and thought content.  Rest of physical exam deferred due to type of encounter  Assessment and Plan:  Pregnancy: G2P1001 at [redacted]w[redacted]d 1. Supervision of high risk pregnancy,  antepartum Fetal movement normal. Will schedule for BPP Having intermittent ctxs. Will schedule for induction next week. - [place] MFM FETAL BPP WO NON STRESS; Future  2. GBS (group B Streptococcus carrier), +RV culture, currently pregnant Intrapartum Ppx  3. Late prenatal care affecting pregnancy in third trimester   Term labor symptoms and general obstetric precautions including but not limited to vaginal bleeding, contractions, leaking of fluid and fetal movement were reviewed in detail with the patient.  I discussed the assessment and treatment plan with the patient. The patient was provided an opportunity to ask questions and all were answered. The patient agreed with the plan and demonstrated an understanding of the instructions. The patient was advised to call back or seek an in-person office evaluation/go to MAU at [facility] for any urgent or concerning symptoms. Please refer to After Visit Summary for other counseling recommendations.   I provided 7 minutes of non-face-to-face time during this encounter.  No follow-ups on file.  Future Appointments  Date Time Provider Department Center  12/20/2022 10:15 AM [facility] NURSE [facility]  12/20/2022 10:30 AM [facility] US6 [facility]  12/28/2022 12:00 AM MC-LD SCHED ROOM [facility]-INDC None  01/10/2023 10:15 AM [facility] CLINICIAN [facility]  02/05/2023  1:30 PM Gerrit Heck, CNM [facility] None    Levie Heritage, DO Center for [employer], [facility]

## 2022-12-19 NOTE — MAU Note (Signed)
Stephanie Gamble is a 22 y.o. at [redacted]w[redacted]d here in MAU reporting: she thinks she's in active labor, having ctxs that are less than 5 minutes apart.  Denies LOF and VB.  Endorses +FM. LMP: NA Onset of complaint: today @ 1000 Pain score: 8 Vitals:   12/19/22 1522  BP: 108/64  Pulse: 99  Resp: 18  Temp: (!) 97.5 F (36.4 C)  SpO2: 97%     FHT: 155 bpm Lab orders placed from triage:   None

## 2022-12-19 NOTE — Discharge Summary (Signed)
Postpartum Discharge Summary    Patient Name: Stephanie Gamble DOB: 16-Sep-2000 MRN: [id number]  Date of admission: 12/19/2022 Delivery date:12/19/2022 Delivering provider: Cam Hai D Date of discharge: 12/21/2022  Admitting diagnosis: Indication for care in labor or delivery [O75.9] Intrauterine pregnancy: [redacted]w[redacted]d     Secondary diagnosis:  Principal Problem:   Indication for care in labor or delivery Active Problems:   MDD (major depressive disorder), recurrent severe, without psychosis (HCC)   Late prenatal care affecting pregnancy   GBS (group B Streptococcus carrier), +RV culture, currently pregnant   Anemia  Additional problems: none    Discharge diagnosis: Term Pregnancy Delivered                                              Post partum procedures: Nexplanon placement   Augmentation: AROM Complications: None  Hospital course: Onset of Labor With Vaginal Delivery      22 y.o. yo [id number] at [redacted]w[redacted]d was admitted in Active Labor on 12/19/2022. Labor course was uncomplicated; second dose of PCN finishing infusing as delivery was occurring.  Membrane Rupture Time/Date: 8:15 PM,12/19/2022  Delivery Method:Vaginal, Spontaneous Operative Delivery:N/A Episiotomy: None Lacerations:  Labial Patient had a postpartum course complicated by baby testing positive for cocaine (CSW reviewed and safe d/c plan in place), self harm behavior prior to presenting for labor (psychiatry consulted and started on Seroquel at bedtime and arranging follow up with behavioral health). Both social work and psychiatry without barriers to discharge.  She is ambulating, tolerating a regular diet, passing flatus, and urinating well. Patient is discharged home in stable condition on 12/21/22.  Newborn Data: Birth date:12/19/2022 Birth time:8:33 PM Gender:Female Living status:Living Apgars:8 ,9  Weight:3062 g (6lb 12oz)  Magnesium Sulfate received: No BMZ received: No Rhophylac:N/A MMR:N/A T-DaP:  ordered PP Flu: No RSV Vaccine received: No Transfusion:No  Immunizations received: There is no immunization history for the selected administration types on file for this patient.  Physical exam  Vitals:   12/20/22 0803 12/20/22 1248 12/20/22 2127 12/21/22 0537  BP: (!) 97/53 (!) 109/50 (!) 110/51 103/61  Pulse: (!) 59 (!) 55 63 (!) 57  Resp: 18 18 16 18   Temp: (!) 97.5 F (36.4 C) (!) 97.4 F (36.3 C) 97.9 F (36.6 C) 98 F (36.7 C)  TempSrc: Oral Oral Oral Oral  SpO2: 100%  99% 100%  Weight:      Height:       General: alert, cooperative, and no distress Lochia: appropriate Uterine Fundus: firm Incision: N/A DVT Evaluation: No evidence of DVT seen on physical exam. Labs: Lab Results  Component Value Date   WBC 8.0 12/19/2022   HGB 10.1 (L) 12/19/2022   HCT 32.7 (L) 12/19/2022   MCV 69.9 (L) 12/19/2022   PLT 305 12/19/2022      Latest Ref Rng & Units 02/07/2022    9:36 PM  CMP  Glucose 70 - 99 mg/dL 147   BUN 6 - 20 mg/dL 7   Creatinine 8.29 - 5.62 mg/dL 1.30   Sodium 865 - 784 mmol/L 134   Potassium 3.5 - 5.1 mmol/L 3.9   Chloride 98 - 111 mmol/L 103   CO2 22 - 32 mmol/L 22   Calcium 8.9 - 10.3 mg/dL 8.6   Total Protein 6.5 - 8.1 g/dL 7.3   Total Bilirubin 0.3 - 1.2 mg/dL 0.3  Alkaline Phos 38 - 126 U/L 48   AST 15 - 41 U/L 14   ALT 0 - 44 U/L 10    Edinburgh Score:    12/20/2022   10:56 AM  Edinburgh Postnatal Depression Scale Screening Tool  I have been able to laugh and see the funny side of things. 1  I have looked forward with enjoyment to things. 2  I have blamed myself unnecessarily when things went wrong. 3  I have been anxious or worried for no good reason. 3  I have felt scared or panicky for no good reason. 2  Things have been getting on top of me. 3  I have been so unhappy that I have had difficulty sleeping. 0  I have felt sad or miserable. 2  I have been so unhappy that I have been crying. 1  The thought of harming myself has  occurred to me. 2  Edinburgh Postnatal Depression Scale Total 19   Edinburgh Postnatal Depression Scale Total: (!) 19   After visit meds:  Allergies as of 12/21/2022       Reactions   Avocado Anaphylaxis   Banana Anaphylaxis   Other Anaphylaxis   Most fruits        Medication List     TAKE these medications    acetaminophen 325 MG tablet Commonly known as: Tylenol Take 2 tablets (650 mg total) by mouth every 4 (four) hours as needed (for pain scale < 4).   escitalopram 10 MG tablet Commonly known as: Lexapro Take 0.5 tablets (5 mg total) by mouth daily for 7 days, THEN 1 tablet (10 mg total) daily. Start taking on: December 07, 2022   ibuprofen 600 MG tablet Commonly known as: ADVIL Take 1 tablet (600 mg total) by mouth every 6 (six) hours.   prenatal multivitamin Tabs tablet Take 1 tablet by mouth daily at 12 noon.   QUEtiapine 50 MG Tb24 24 hr tablet Commonly known as: SEROQUEL XR Take 1 tablet (50 mg total) by mouth at bedtime.   senna-docusate 8.6-50 MG tablet Commonly known as: Senokot-S Take 2 tablets by mouth 2 (two) times daily as needed for mild constipation.         Discharge home in stable condition Infant Feeding: Bottle Infant Disposition:home with mother Discharge instruction: per After Visit Summary and Postpartum booklet. Activity: Advance as tolerated. Pelvic rest for 6 weeks.  Diet: routine diet Future Appointments: Future Appointments  Date Time Provider Department Center  01/10/2023 10:15 AM [facility]  02/05/2023  1:30 PM Gerrit Heck, CNM [facility] None   Follow up Visit:  Arabella Merles, CNM  P [facility] Admin Please schedule this patient for Postpartum visit in: 6 weeks with the following provider: Any provider In-Person For C/S patients schedule nurse incision check in weeks 2 weeks: no Low risk pregnancy complicated by: none Delivery mode:  SVD Anticipated Birth Control: plans inpt  Nexplanon PP Procedures needed: routine Pap Schedule Integrated BH visit: no   12/21/2022 [name], MD

## 2022-12-20 ENCOUNTER — Other Ambulatory Visit: Payer: MEDICAID

## 2022-12-20 ENCOUNTER — Ambulatory Visit: Payer: MEDICAID

## 2022-12-20 ENCOUNTER — Encounter (HOSPITAL_COMMUNITY): Payer: Self-pay | Admitting: Family Medicine

## 2022-12-20 LAB — RPR: RPR Ser Ql: NONREACTIVE

## 2022-12-20 LAB — CULTURE, OB URINE: Culture: <10000 — AB

## 2022-12-20 MED ORDER — LIDOCAINE HCL 1 % IJ SOLN
0.0000 mL | Freq: Once | INTRAMUSCULAR | Status: DC | PRN
  Filled 2022-12-20: qty 20

## 2022-12-20 MED ORDER — ETONOGESTREL 68 MG ~~LOC~~ IMPL
68.0000 mg | DRUG_IMPLANT | Freq: Once | SUBCUTANEOUS | Status: AC
  Administered 2022-12-21: 68 mg via SUBCUTANEOUS
  Filled 2022-12-20: qty 1

## 2022-12-20 MED ORDER — ETONOGESTREL 68 MG ~~LOC~~ IMPL: 68.0000 mg | DRUG_IMPLANT | Freq: Once | SUBCUTANEOUS | Status: DC

## 2022-12-20 MED ORDER — LIDOCAINE HCL 1 % IJ SOLN: 0.0000 mL | Freq: Once | INTRAMUSCULAR | Status: DC | PRN

## 2022-12-20 NOTE — Clinical Social Work Maternal (Signed)
CLINICAL SOCIAL WORK MATERNAL/CHILD NOTE  Patient Details  Name: Stephanie Gamble MRN: [id number] Date of Birth: Jun 17, 2000  Date:  12/20/2022  Clinical Social Worker Initiating Note:  Enos Fling Date/Time: Initiated:  12/20/22/1430     Child's Name:  Stephanie Gamble   Biological Parents:  Mother, Father (Stephanie Gamble 22 years old Stephanie Gamble 2000-07-01)   Need for Interpreter:  None   Reason for Referral:  Behavioral Health Concerns, Current Substance Use/Substance Use During Pregnancy     Address:  [address] [place] [address]    Phone number:  [phone] (home)     Additional phone number:   Household Members/Support Persons (HM/SP):   Household Member/Support Person 1, Household Member/Support Person 2   HM/SP Name Relationship DOB or Age  HM/SP -1 Stephanie Gamble MOB's mom    HM/SP -2 Parks Ranger MOB's son 04-02-2019  HM/SP -3        HM/SP -4        HM/SP -5        HM/SP -6        HM/SP -7        HM/SP -8          Natural Supports (not living in the home):  Immediate Family, Spouse/significant other   Professional Supports: None   Employment: Unemployed   Type of Work:     Education:  Engineer, agricultural   Homebound arranged:    Surveyor, quantity Resources:  Medicaid   Other Resources:  Sales executive  , [employer]   Cultural/Religious Considerations Which May Impact Care:    Strengths:  Understanding of illness, Home prepared for child  , Merchandiser, retail, Ability to meet basic needs     Psychotropic Medications:         Pediatrician:    [employer] area  Pediatrician List:   [facility]  [employer]    [facility]      Pediatrician Fax Number:    Risk Factors/Current Problems:  Mental Health Concerns  , Substance Use     Cognitive State:  Alert  , Able to Concentrate  , Linear Thinking  , Insightful  , Goal Oriented      Mood/Affect:  Interested  , Comfortable  , Calm  , Relaxed     CSW Assessment: CSW received a consult for drug exposed newborn, Edinburgh score of 19 and history of Self-harm and Suicide Ideations. CSW met MOB at bedside to complete a full psychosocial assessment and offer support. CSW entered the room, introduced herself and acknowledged that FOB was present. CSW asked MOB for privacy could FOB step out for the assessment; MOB was agreeable and FOB stepped out. CSW explained her role and the reason for the visit. MOB presented bonding/holding the infant and was polite, easy to engage, receptive to meeting with CSW, and appeared forthcoming.   CSW acknowledged Edinburgh score of 19 and listened to MOB explore her feelings about transitioning into motherhood. Patient requests a referral to Integrated Behavioral Health. Patient verbalizes understanding that the appointment will be virtual. CSW asked MOB about the current thoughts of self harm. MOB reported having current thoughts of self harm with no plan to complete the thoughts. CSW has reached out to the MOB's OB team and asked for a psych consult and MOB was agreeable of this plan in order to establish a safe baseline  prior to discharge. CSW inquired about MOB's mental health history. MOB reported being diagnosed with BPD, Bipolar and Anxiety at the age of 22 years old. MOB reported overtime she has had a medication regiment but nothing has been consistent since her pregnancies. MOB reported constant mood swings due to her BPD. MOB reported her Bipolar as maniac and her last episode was 1 week ago that resulted in acts of self-harm. MOB reported currently being prescribed Lexapro 10mg  for support. MOB reported coping skills that included drawing and music as support. MOB reported PPD with her first child in 2020 with symptoms that included sadness. CSW provided MOB with general and PP specialized therapist [place] for support. CSW provided  education regarding the baby blues period vs. perinatal mood disorders, discussed treatment and gave resources for mental health follow up if concerns arise.  CSW recommends self-evaluation during the postpartum time period using the New Mom Checklist from Postpartum Progress and encouraged MOB to contact a medical professional if symptoms are noted at any time. CSW assessed for safety with MOB SI/HI/DV;MOB denied all.    MOB reported having all essential items for the infant including a carseat, bassinet and crib for safe sleeping. CSW asked MOB does she receive support resources; MOB said yes(WIC and food stamps). CSW provided review of Sudden Infant Death Syndrome (SIDS) precautions.   CSW informed MOB due to cocaine during her pregnancy; the hospital will perform a UDS and CDS on the infant. If the screenings return with positive results a report to CPS will be made; MOB was understanding. CSW informed MOB that the infant's urine tested positive for cocaine and CPS report will be made; MOB was understanding. MOB reported using cocaine 1 time during her pregnancy last week "to just try it". MOB reported the reason for the cocaine use was due to being around people that had it and not having self control/ impulse decisions. MOB reported THC in her history and has not used since the early trimesters of her pregnancy.  CSW made a CPS report with [place] due infant's urine testing positive for cocaine.   CSW Plan/Description:   No Further Intervention Required/No Barriers to Discharge, Sudden Infant Death Syndrome (SIDS) Education, Perinatal Mood and Anxiety Disorder (PMADs) Education, Hospital Drug Screen Policy Information, Other Information/Referral to [facility], CSW Will Continue to Monitor Umbilical Cord Tissue Drug Screen Results and Make Report if Jena Gauss 12/20/2022, 2:39 PM

## 2022-12-20 NOTE — Progress Notes (Signed)
CSW delivered CPS social worker to the room with MOB and the infant.   CSW has updated support staff on the visit and will continue to update once more information is received.   Enos Fling, Theresia Majors Clinical Social Worker [phone]

## 2022-12-20 NOTE — Anesthesia Postprocedure Evaluation (Signed)
Anesthesia Post Note  Patient: Stephanie Gamble  Procedure(s) Performed: AN AD HOC LABOR EPIDURAL     Patient location during evaluation: Mother Baby Anesthesia Type: Epidural Level of consciousness: awake and alert Pain management: pain level controlled Vital Signs Assessment: post-procedure vital signs reviewed and stable Respiratory status: spontaneous breathing, nonlabored ventilation and respiratory function stable Cardiovascular status: stable Postop Assessment: no headache, no backache and epidural receding Anesthetic complications: no   No notable events documented.  Last Vitals:  Vitals:   12/19/22 2339 12/20/22 0408  BP: 116/67 107/64  Pulse: 91 74  Resp: 16 17  Temp: 37.2 C 36.7 C  SpO2: 100% 98%    Last Pain:  Vitals:   12/20/22 0515  TempSrc:   PainSc: 1    Pain Goal:                   [name]

## 2022-12-20 NOTE — Lactation Note (Signed)
This note was copied from a baby's chart. Lactation Consultation Note  Patient Name: Stephanie Gamble [id number] Date: 12/20/2022 Age:21 hours Reason for consult: Follow-up assessment Baby was positive for cocaine. Mom can't BF.   Maternal Data    Feeding Mother's Current Feeding Choice: Formula  LATCH Score                    Lactation Tools Discussed/Used    Interventions    Discharge    Consult Status Consult Status: Complete    Murrel Freet G 12/20/2022, 6:30 AM

## 2022-12-20 NOTE — Lactation Note (Signed)
This note was copied from a baby's chart. Lactation Consultation Note  Patient Name: Stephanie Gamble [id number] Date: 12/20/2022 Age:21 hours  Attempted to see mom. Everyone sleeping. Informed RN I needed to see her. RN stated she had to see mom at 0400 and LC could come after that.   Maternal Data    Feeding    LATCH Score                    Lactation Tools Discussed/Used    Interventions    Discharge    Consult Status      Charyl Dancer 12/20/2022, 4:09 AM

## 2022-12-20 NOTE — Lactation Note (Signed)
This note was copied from a baby's chart. Lactation Consultation Note  Patient Name: Stephanie Gamble [id number] Date: 12/20/2022 Age:21 hours  RN informed LC that she wants to wait until morning to see Lactation. Mom is tired.   Maternal Data    Feeding    LATCH Score                    Lactation Tools Discussed/Used    Interventions    Discharge    Consult Status      Charyl Dancer 12/20/2022, 4:14 AM

## 2022-12-20 NOTE — Progress Notes (Signed)
Post Partum Day #1 Subjective: no complaints, up ad lib, and tolerating PO; breastfeeding going well; still desires Nexplanon placement inpatient  Objective: Blood pressure (!) 97/53, pulse (!) 59, temperature (!) 97.5 F (36.4 C), temperature source Oral, resp. rate 18, height 5\' 3"  (1.6 m), weight 74.8 kg, last menstrual period 03/21/2022, SpO2 100%, unknown if currently breastfeeding.  Physical Exam:  General: cooperative, fatigued, and no distress Lochia: appropriate Uterine Fundus: firm DVT Evaluation: No evidence of DVT seen on physical exam.  Recent Labs    12/19/22 1546  HGB 10.1*  HCT 32.7*    Assessment/Plan: Plan for discharge tomorrow Nexplanon ordered for placement today prn Urine culture TOC pending Continue home Lexapro 10mg  dosing   LOS: 1 day   Arabella Merles, CNM 12/20/2022, 9:15 AM

## 2022-12-21 ENCOUNTER — Encounter (HOSPITAL_COMMUNITY): Payer: Self-pay | Admitting: Family Medicine

## 2022-12-21 DIAGNOSIS — F319 Bipolar disorder, unspecified: Secondary | ICD-10-CM

## 2022-12-21 DIAGNOSIS — Z30017 Encounter for initial prescription of implantable subdermal contraceptive: Secondary | ICD-10-CM | POA: Diagnosis not present

## 2022-12-21 HISTORY — PX: PR INSERTION DRUG DELIVERY IMPLANT: 11981

## 2022-12-21 MED ORDER — ACETAMINOPHEN 325 MG PO TABS: 650.0000 mg | ORAL_TABLET | ORAL | 0 refills | Status: AC | PRN

## 2022-12-21 MED ORDER — IBUPROFEN 600 MG PO TABS: 600.0000 mg | ORAL_TABLET | Freq: Four times a day (QID) | ORAL | 0 refills | Status: AC

## 2022-12-21 MED ORDER — QUETIAPINE FUMARATE ER 50 MG PO TB24: 50.0000 mg | ORAL_TABLET | Freq: Every day | ORAL | 1 refills | Status: AC

## 2022-12-21 MED ORDER — SENNOSIDES-DOCUSATE SODIUM 8.6-50 MG PO TABS: 2.0000 | ORAL_TABLET | Freq: Two times a day (BID) | ORAL | 0 refills | Status: AC | PRN

## 2022-12-21 MED ORDER — QUETIAPINE FUMARATE ER 50 MG PO TB24
50.0000 mg | ORAL_TABLET | Freq: Every day | ORAL | Status: DC
  Filled 2022-12-21: qty 1

## 2022-12-21 NOTE — Procedures (Signed)
Nexplanon Insertion PROCEDURE NOTE Ms. Stephanie Gamble is a 22 y.o. [phone] here for Nexplanon  insertion.  No other gynecologic concerns.  Patient was given informed consent, she signed consent form.  Patient does understand that irregular bleeding is a very common side effect of this medication. She was advised to have backup contraception for one week after placement. Pregnancy test in clinic today was negative.  Appropriate time out taken.  Patient's left arm was prepped and draped in the usual sterile fashion.. The ruler used to measure and mark insertion area.  Patient was prepped with alcohol swab and then injected with 3 ml of 1% lidocaine.  She was prepped with betadine, Nexplanon removed from packaging,  Device confirmed in needle, then inserted full length of needle and withdrawn per handbook instructions. Nexplanon was able to palpated in the patient's arm; patient palpated the insert herself. There was minimal blood loss.  Patient insertion site covered with guaze and a pressure bandage to reduce any bruising.  The patient tolerated the procedure well and was given post procedure instructions.   [name] 12/21/2022 1:34 PM

## 2022-12-21 NOTE — Consult Note (Signed)
Inpatient Face-to-Face Psychiatry Consult   Reason for Consult:  Bipolar, BPD, Recent self-harm. Immediate Postpartum  Referring Physician:  Katrinka Blazing, [place] Patient Identification: Stephanie Gamble MRN:  [id number] Principal Diagnosis: Indication for care in labor or delivery Diagnosis:  Principal Problem:   Indication for care in labor or delivery Active Problems:   MDD (major depressive disorder), recurrent severe, without psychosis (HCC)   Late prenatal care affecting pregnancy   GBS (group B Streptococcus carrier), +RV culture, currently pregnant   Anemia   HPI:  Stephanie Gamble is a 22 y.o. female  G2P1001 patient admitted for delivery of 40-week 0-day baby girl. Psychiatry consult  was requested for history of bipolar, borderline personality disorder, recent self-harm.  At time of assessment, patient denies symptoms of depression.  She does endorse feeling overwhelmed related to raising her sisters children along with her 70-year-old son.  Sister is incarcerated and has 4 children (boy 28-year-old, and 3 girls ages 67, 19, and 61).  Patient states that she lives with her biological mother Monday through Friday to help care for her nieces and nephews.  She lives with her current baby's father on Saturday and Sundays in a small house in [place].  Patient states she previously had a psychiatric health history of borderline personality disorder, bipolar disorder, anxiety and depression which were diagnosed at age 47 years old.  She states she has had 4-5 previous psychiatric hospitalizations between the ages of 16-19 years.  She states that when she was 22 years old she did have a suicide attempt.  This is the last time that she has had any self-harm.  She states that she does have thoughts of self-harm intermittently and last suicidal ideation was in 2023, however she did not have a plan and did not act on these thoughts.  Patient specifically denies any active suicidal ideation, plan, or  intent.  She denies any homicidal ideation, specifically no thoughts to harm her baby or the other children in her care.  Patient denies a history of postpartum psychosis, and denies any current auditory or visual hallucinations.  She does not appear to be responding to internal stimuli. Patient did maintain on Lexapro during her pregnancy.  She states she had also previously been prescribed Seroquel and Lamictal for mood stabilization.  She did not continue these through her pregnancy, and does note that she can get agitated easily since she has been off of these.  She states that when she has "mood swings she feels like she is not in control of herself", however she is able to practice coping strategies which are calming for her.  She states that she has not been in any physical fights, does not become verbally aggressive, but can feel some agitation every day.  She is agreeable to restarting Seroquel, and is interested in taking an extended release tablets so that she would take it once daily.  She believes that she had previously been on 50 mg twice daily.  She denies that this made her sedated, and she does not feel like she would have difficulty awakening to care for her baby at this dose.  Patient previously did take hydroxyzine during her pregnancy for anxiety, but states that this made her skin feel itchy and she does not wish to continue this. Patient endorses using cocaine once prior to delivering the baby.  She states she had friends tell her to try it, and she states she did not like it.  She does not intend to use cocaine  again.  She states that she was smoking marijuana regularly before her pregnancy, but did not smoke marijuana during pregnancy.  She does not drink alcohol.  She does state nicotine daily.  She is not interested in discontinuing nicotine at this time.  Patient states she has contacted for help for daycare for the children she is caring for. Patient [place] [facility]  She does not have a current outpatient psychiatrist, but would like to reestablishing care.  Patient does not have access to weapons.   Past Psychiatric History: borderline personality disorder, bipolar disorder, anxiety and depression   Risk to Self: Is patient at risk for suicide?: No Risk to Others:   denies Prior Inpatient Therapy:  yes, last at age 64 years Prior Outpatient Therapy:  Yes, at [facility] in [place], [place]   Past Medical History:  Past Medical History:  Diagnosis Date   Anxiety    Asthma    Medical history non-contributory    Personality disorder (HCC)    Self mutilating behavior     Past Surgical History:  Procedure Laterality Date   NO PAST SURGERIES     PR INSERTION DRUG DELIVERY IMPLANT  12/21/2022   Family History:  Family History  Problem Relation Age of Onset   Healthy Mother    Hypertension Father    Family Psychiatric  History:   Sister incarcerated    Social History:  Social History   Substance and Sexual Activity  Alcohol Use Never     Social History   Substance and Sexual Activity  Drug Use Yes   Types: Marijuana   Comment: last used 10/2018    Social History   Socioeconomic History   Marital status: Single    Spouse name: Not on file   Number of children: Not on file   Years of education: Not on file   Highest education level: Not on file  Occupational History   Not on file  Tobacco Use   Smoking status: Never   Smokeless tobacco: Never  Vaping Use   Vaping status: Every Day  Substance and Sexual Activity   Alcohol use: Never   Drug use: Yes    Types: Marijuana    Comment: last used 10/2018   Sexual activity: Not Currently    Birth control/protection: None  Other Topics Concern   Not on file  Social History Narrative   Not on file   Social Determinants of Health   Financial Resource Strain: Not on file  Food Insecurity: No Food Insecurity  (12/19/2022)   Hunger Vital Sign    Worried About Running Out of Food in the Last Year: Never true    Ran Out of Food in the Last Year: Never true  Transportation Needs: No Transportation Needs (12/19/2022)   PRAPARE - Administrator, Civil Service (Medical): No    Lack of Transportation (Non-Medical): No  Physical Activity: Not on file  Stress: Not on file  Social Connections: Not on file   Additional Social History:    Allergies:   Allergies  Allergen Reactions   Avocado Anaphylaxis   Banana Anaphylaxis   Other Anaphylaxis    Most fruits    Labs:  Results for orders placed or performed during the hospital encounter of 12/19/22 (from the past 48 hour(s))  Culture, OB Urine     Status: Abnormal   Collection Time: 12/19/22  9:24 PM   Specimen: OB Clean Catch;  Urine  Result Value Ref Range   Specimen Description OB CLEAN CATCH    Special Requests NONE    Culture (A)     <10,000 COLONIES/mL INSIGNIFICANT GROWTH NO GROUP B STREP (S.AGALACTIAE) ISOLATED Performed at [facility], [address] [place], [place] [address]    Report Status 12/20/2022 FINAL     Current Facility-Administered Medications  Medication Dose Route Frequency Provider Last Rate Last Admin   acetaminophen (TYLENOL) tablet 650 mg  650 mg Oral Q4H PRN Arabella Merles, CNM   650 mg at 12/21/22 1624   benzocaine-Menthol (DERMOPLAST) 20-0.5 % topical spray 1 Application  1 Application Topical PRN Arabella Merles, CNM       coconut oil  1 Application Topical PRN Arabella Merles, CNM       witch hazel-glycerin (TUCKS) pad 1 Application  1 Application Topical PRN Arabella Merles, CNM       And   dibucaine (NUPERCAINAL) 1 % rectal ointment 1 Application  1 Application Rectal PRN Arabella Merles, CNM       diphenhydrAMINE (BENADRYL) capsule 25 mg  25 mg Oral Q6H PRN Arabella Merles, CNM       escitalopram (LEXAPRO) tablet 10 mg  10 mg Oral Daily Cam Hai D, CNM   10 mg at 12/21/22  1059   ibuprofen (ADVIL) tablet 600 mg  600 mg Oral Q6H Cam Hai D, CNM   600 mg at 12/21/22 1059   lidocaine (XYLOCAINE) 1 % (with pres) injection 0-20 mL  0-20 mL Intradermal Once PRN Arabella Merles, CNM       measles, mumps & rubella vaccine (MMR) injection 0.5 mL  0.5 mL Subcutaneous Once Arabella Merles, CNM       ondansetron [facility]) tablet 4 mg  4 mg Oral Q4H PRN Arabella Merles, CNM       Or   ondansetron [facility]) injection 4 mg  4 mg Intravenous Q4H PRN Cam Hai D, CNM       oxyCODONE (Oxy IR/ROXICODONE) immediate release tablet 5 mg  5 mg Oral Q4H PRN Cam Hai D, CNM   5 mg at 12/21/22 1624   prenatal multivitamin tablet 1 tablet  1 tablet Oral Q1200 Cam Hai D, CNM   1 tablet at 12/21/22 1059   QUEtiapine (SEROQUEL XR) 24 hr tablet 50 mg  50 mg Oral QHS Mariel Craft, MD       senna-docusate (Senokot-S) tablet 2 tablet  2 tablet Oral Q24H Cam Hai D, CNM   2 tablet at 12/20/22 2318   simethicone (MYLICON) chewable tablet 80 mg  80 mg Oral PRN Arabella Merles, CNM       Tdap Leda Min) injection 0.5 mL  0.5 mL Intramuscular Once Arabella Merles, CNM       zolpidem (AMBIEN) tablet 5 mg  5 mg Oral QHS PRN Arabella Merles, CNM        Musculoskeletal: Strength & Muscle Tone: within normal limits Gait & Station: normal Patient leans: N/A  Psychiatric Specialty Exam:  Presentation  General Appearance: Appropriate for Environment; Neat  Eye Contact:Good  Speech:Clear and Coherent; Normal Rate  Speech Volume:Normal  Handedness:Right   Mood and Affect  Mood:Euthymic  Affect:Appropriate; Congruent   Thought Process  Thought Processes:Coherent; Linear  Descriptions of Associations:Intact  Orientation:Full (Time, Place and Person)  Thought Content:Logical  History of Schizophrenia/Schizoaffective disorder:No data recorded Duration of Psychotic Symptoms:No data recorded Hallucinations:Hallucinations: None  Ideas of  Reference:None  Suicidal Thoughts:Suicidal Thoughts: No  Homicidal Thoughts:Homicidal Thoughts: No   Sensorium  Memory:Immediate Good; Recent Good; Remote Good  Judgment:Fair  Insight:Fair   Executive Functions  Concentration:Good  Attention Span:Good  Recall:Good  Fund of Knowledge:Good  Language:Good   Psychomotor Activity  Psychomotor Activity:Psychomotor Activity: Normal   Assets  Assets:Desire for Improvement; Manufacturing systems engineer; Physical Health; Financial Resources/Insurance; Housing; Resilience; Social Support   Sleep  Sleep:Sleep: Fair   Physical Exam: Physical Exam Vitals and nursing note reviewed.  Constitutional:      Appearance: Normal appearance.  HENT:     Head: Normocephalic.  Cardiovascular:     Rate and Rhythm: Normal rate.  Pulmonary:     Effort: Pulmonary effort is normal. No respiratory distress.  Musculoskeletal:        General: Normal range of motion.  Neurological:     General: No focal deficit present.     Mental Status: She is alert and oriented to person, place, and time.    Review of Systems  Constitutional: Negative.   Respiratory: Negative.    Psychiatric/Behavioral:  Negative for depression, hallucinations, memory loss and suicidal ideas. The patient is not nervous/anxious and does not have insomnia.    Blood pressure 103/61, pulse (!) 57, temperature 98 F (36.7 C), temperature source Oral, resp. rate 18, height 5\' 3"  (1.6 m), weight 74.8 kg, last menstrual period 03/21/2022, SpO2 100%, unknown if currently breastfeeding. Body mass index is 29.23 kg/m.  Treatment Plan Summary: Plan Discharge with resources  While future psychiatric events cannot be accurately predicted, the patient does not currently require acute inpatient psychiatric care and does not currently meet [facility] involuntary commitment criteria.  Disposition: No evidence of imminent risk to self or others at present.   Patient does not meet  criteria for psychiatric inpatient admission. Supportive therapy provided about ongoing stressors. Discussed crisis plan, support from social network, calling 911, coming to the Emergency Department, and calling Suicide Hotline.  Resources for [facility] behavioral clinic placed in after visit summary.  Patient aware that she can also seek behavioral health urgent care there 24 hours a day, 7 days a week.   Psychiatry will sign off.  Please re-consult for any future acute psychiatric concerns.    Mariel Craft, MD 12/21/2022 4:38 PM

## 2022-12-21 NOTE — Discharge Instructions (Addendum)
Please go to the [facility] for any mental health concerns.  24 hour mental heath urgent care.  [address] for an appointment [phone]  They also have walk-in hours to establish care: Monday - Friday Doors open at 7 AM to complete paperwork.

## 2022-12-21 NOTE — Progress Notes (Signed)
CSW spoke with CPS social worker Elease Hashimoto and she reported barriers to discharge for the infant due to MOB's mental health and the home study not being completed.   MOB can remain with the infant until discharged and the infant will remain a baby patient until further directions.   Barriers to the infant's discharge at this time.   Enos Fling, Theresia Majors Clinical Social Worker [phone]

## 2022-12-21 NOTE — Progress Notes (Signed)
CSW received a update from CPS social worker Bernadene Bell that a home check will be completed along with a safety plan and Infant and MOB are able to discharged together.  CSW identifies no further need for intervention and no barriers to discharge at this time.  Enos Fling, Theresia Majors Clinical Social Worker [phone]

## 2022-12-28 ENCOUNTER — Inpatient Hospital Stay (HOSPITAL_COMMUNITY): Admission: RE | Admit: 2022-12-28 | Payer: MEDICAID | Source: Home / Self Care | Admitting: Family Medicine

## 2022-12-28 ENCOUNTER — Inpatient Hospital Stay (HOSPITAL_COMMUNITY): Payer: MEDICAID

## 2022-12-28 ENCOUNTER — Telehealth (HOSPITAL_COMMUNITY): Payer: Self-pay | Admitting: *Deleted

## 2022-12-28 NOTE — Telephone Encounter (Signed)
12/28/2022  Name: Niomi Towsend MRN: [id number] DOB: April 25, 2000  Reason for Call:  Transition of Care Hospital Discharge Call  Contact Status: Patient Contact Status: Complete  Language assistant needed: Interpreter Mode: Interpreter Not Needed        Follow-Up Questions: Do You Have Any Concerns About Your Health As You Heal From Delivery?: Yes What Concerns Do You Have About Your Health?: Patient reports having daily migraines that last for several hours.  She has tried taking ibuprofen, without relief.  I suggested she try Tylenol (AVS at discharge).  Advised to call provider if headaches continue to be unrelieved by either ibuprofen or Tylenol. Do You Have Any Concerns About Your Infants Health?: No  Edinburgh Postnatal Depression Scale:  In the Past 7 Days: I have been able to laugh and see the funny side of things.: As much as I always could I have looked forward with enjoyment to things.: As much as I ever did I have blamed myself unnecessarily when things went wrong.: Yes, most of the time I have been anxious or worried for no good reason.: Yes, very often I have felt scared or panicky for no good reason.: No, not at all Things have been getting on top of me.: No, I have been coping as well as ever I have been so unhappy that I have had difficulty sleeping.: Not at all I have felt sad or miserable.: No, not at all I have been so unhappy that I have been crying.: No, never The thought of harming myself has occurred to me.: Never Edinburgh Postnatal Depression Scale Total: 6  PHQ2-9 Depression Scale:     Discharge Follow-up: Edinburgh score requires follow up?: No Patient was advised of the following resources:: Support Group, Breastfeeding Support Group (declines postpartum group information via email)  Post-discharge interventions: Reviewed Newborn Safe Sleep Practices  Salena Saner, RN 12/28/2022 11:04

## 2022-12-31 NOTE — BH Specialist Note (Unsigned)
Integrated Behavioral Health via Telemedicine Visit  12/31/2022 Stephanie Gamble [id number]  Number of Integrated Behavioral Health Clinician visits: No data recorded Session Start time: No data recorded  Session End time: No data recorded Total time in minutes: No data recorded  Referring Provider: Candelaria Celeste, DO Patient/Family location: Home*** [facility] Provider location: [facility]  All persons participating in visit: Patient Stephanie Gamble and [facility] Dedria Endres ***  Types of Service: {CHL AMB TYPE OF SERVICE:[phone]}  I connected with Dorena Schunk and/or Capitola Harding's {family members:20773} via  Telephone or Video Enabled Telemedicine Application  (Video is Caregility application) and verified that I am speaking with the correct person using two identifiers. Discussed confidentiality: Yes   I discussed the limitations of telemedicine and the availability of in person appointments.  Discussed there is a possibility of technology failure and discussed alternative modes of communication if that failure occurs.  I discussed that engaging in this telemedicine visit, they consent to the provision of behavioral healthcare and the services will be billed under their insurance.  Patient and/or legal guardian expressed understanding and consented to Telemedicine visit: Yes   Presenting Concerns: Patient and/or family reports the following symptoms/concerns: *** Duration of problem: ***; Severity of problem: {Mild/Moderate/Severe:20260}  Patient and/or Family's Strengths/Protective Factors: {CHL AMB BH PROTECTIVE [phone]}  Goals Addressed: Patient will:  Reduce symptoms of: {IBH Symptoms:21014056}   Increase knowledge and/or ability of: {IBH Patient Tools:21014057}   Demonstrate ability to: {IBH Goals:21014053}  Progress towards Goals: {CHL AMB BH PROGRESS TOWARDS  GOALS:[phone]}  Interventions: Interventions utilized:  {IBH Interventions:21014054} Standardized Assessments completed: {IBH Screening Tools:21014051}  Patient and/or Family Response: Patient agrees with treatment plan. ***  Assessment: Patient currently experiencing ***.   Patient may benefit from psychoeducation and brief therapeutic interventions regarding coping with symptoms of *** .  Plan: Follow up with behavioral health clinician on : *** Behavioral recommendations:  -*** -*** Referral(s): {IBH Referrals:21014055}  I discussed the assessment and treatment plan with the patient and/or parent/guardian. They were provided an opportunity to ask questions and all were answered. They agreed with the plan and demonstrated an understanding of the instructions.   They were advised to call back or seek an in-person evaluation if the symptoms worsen or if the condition fails to improve as anticipated.  Rae Lips, LCSW     10/18/2022    2:09 PM  Depression screen PHQ 2/9  Decreased Interest 3  Down, Depressed, Hopeless 3  PHQ - 2 Score 6  Altered sleeping 3  Tired, decreased energy 3  Change in appetite 1  Feeling bad or failure about yourself  3  Trouble concentrating 1  Moving slowly or fidgety/restless 0  Suicidal thoughts 0  PHQ-9 Score 17      10/18/2022    2:09 PM  GAD 7 : Generalized Anxiety Score  Nervous, Anxious, on Edge 2  Control/stop worrying 3  Worry too much - different things 3  Trouble relaxing 3  Restless 2  Easily annoyed or irritable 3  Afraid - awful might happen 2  Total GAD 7 Score 18

## 2023-01-10 ENCOUNTER — Telehealth: Payer: Self-pay | Admitting: Lactation Services

## 2023-01-10 ENCOUNTER — Ambulatory Visit: Payer: MEDICAID | Admitting: Clinical

## 2023-01-10 DIAGNOSIS — F339 Major depressive disorder, recurrent, unspecified: Secondary | ICD-10-CM

## 2023-01-10 NOTE — Patient Instructions (Signed)
[facility] [address] [place], [place] [address] [phone] (main office) [phone] (Jamie's office)  New Parent Support Groups [email or web address]   [facility]  [address], [place], [place] [address] [phone] or [phone] WALK-IN URGENT CARE 24/7 FOR ANYONE [address], [place], [place]  [phone] Fax: [phone] [email or web address] *Interpreters available *Accepts all insurance and uninsured for Urgent Care needs *Accepts Medicaid and uninsured for outpatient treatment (below)    ONLY FOR [place] Residents  Below:   Outpatient New Patient Assessment/Therapy Walk-ins:        Monday -Thursday 8am until slots are full.        Every Friday 1pm-4pm  (first come, first served)                   New Patient Psychiatry/Medication Management        Monday-Friday 8am-11am (first come, first served)              For all walk-ins we ask that you arrive by 7:15am, because patients will be seen in the order of arrival.     

## 2023-01-10 NOTE — Telephone Encounter (Addendum)
Called patient at request of Lennie Muckle, [facility]. Patient with concerns that her birth control is causing decreased milk supply. Patient's mother answered and took message to have patient give [place] a call.    Per chart review, patient received Nexplanon in the hospital.

## 2023-01-16 NOTE — Telephone Encounter (Signed)
Returned call to patient.   Patient reports she has the Nexplanon. She reports she was informed it can slow down her milk production.   She is latching infant to the breast every 1-2 hours and will only latch for 10 minutes, she nurses on both breasts with each feeding.   Mom is not sure infant is swallowing.   Infant gets a bottle mainly at night, 3 ounces each time. She is not pumping at night and will go about 5 hours with no pumping at night. Mom is giving   She was pumping every 2 hours, but was not able to get milk out with the pump so she hand expresses and gets about 1.5 ounces, she is feeding this this to infant.   Patient with manual pump, she has not received her pump in the mail yet. She ordered a Medela Pump. She is not sure what size flanges she uses. She pumps for about 30 minutes with the manual pump and not able to get milk out. She reports tissue pulls about 1/2 way into the barrel. Reviewed she most likely needs smaller flanges.   Reviewed supply and demand and importance of emptying the breast at least 8 times a day with baby or pump to protect milk supply.   Offered mom an OP Lactation appointment. Reviewed concerns with infant needing to feed so frequently, pump not working and milk supply concerns. She declined at this time saying they are really busy. She has call back number to call with any questions or concerns as needed.   Reviewed [facility] also has a [facility].

## 2023-02-05 ENCOUNTER — Ambulatory Visit: Payer: MEDICAID

## 2023-10-16 ENCOUNTER — Emergency Department (HOSPITAL_COMMUNITY)
Admission: EM | Admit: 2023-10-16 | Discharge: 2023-10-16 | Disposition: A | Discharge status: Home / Self Care | Payer: MEDICAID | Attending: Emergency Medicine | Admitting: Emergency Medicine

## 2023-10-16 ENCOUNTER — Emergency Department (HOSPITAL_COMMUNITY): Payer: MEDICAID

## 2023-10-16 ENCOUNTER — Encounter (HOSPITAL_COMMUNITY): Payer: Self-pay

## 2023-10-16 DIAGNOSIS — N3000 Acute cystitis without hematuria: Secondary | ICD-10-CM | POA: Diagnosis not present

## 2023-10-16 DIAGNOSIS — M545 Low back pain, unspecified: Secondary | ICD-10-CM | POA: Diagnosis present

## 2023-10-16 LAB — CBC WITH DIFFERENTIAL/PLATELET
Abs Immature Granulocytes: 0.01 K/uL (ref 0.00–0.07)
Basophils Absolute: 0 K/uL (ref 0.0–0.1)
Basophils Relative: 1 %
Eosinophils Absolute: 0 K/uL (ref 0.0–0.5)
Eosinophils Relative: 1 %
HCT: 44.8 % (ref 36.0–46.0)
Hemoglobin: 13.9 g/dL (ref 12.0–15.0)
Immature Granulocytes: 0 %
Lymphocytes Relative: 29 %
Lymphs Abs: 1.8 K/uL (ref 0.7–4.0)
MCH: 23.1 pg — ABNORMAL LOW (ref 26.0–34.0)
MCHC: 31 g/dL (ref 30.0–36.0)
MCV: 74.3 fL — ABNORMAL LOW (ref 80.0–100.0)
Monocytes Absolute: 0.4 K/uL (ref 0.1–1.0)
Monocytes Relative: 6 %
Neutro Abs: 3.9 K/uL (ref 1.7–7.7)
Neutrophils Relative %: 63 %
Platelets: 416 K/uL — ABNORMAL HIGH (ref 150–400)
RBC: 6.03 MIL/uL — ABNORMAL HIGH (ref 3.87–5.11)
RDW: 21.2 % — ABNORMAL HIGH (ref 11.5–15.5)
Smear Review: NORMAL
WBC: 6.1 K/uL (ref 4.0–10.5)
nRBC: 0 % (ref 0.0–0.2)

## 2023-10-16 LAB — COMPREHENSIVE METABOLIC PANEL WITH GFR
ALT: 198 U/L — ABNORMAL HIGH (ref 0–44)
AST: 76 U/L — ABNORMAL HIGH (ref 15–41)
Albumin: 5 g/dL (ref 3.5–5.0)
Alkaline Phosphatase: 86 U/L (ref 38–126)
Anion gap: 16 — ABNORMAL HIGH (ref 5–15)
BUN: 13 mg/dL (ref 6–20)
CO2: 19 mmol/L — ABNORMAL LOW (ref 22–32)
Calcium: 9.8 mg/dL (ref 8.9–10.3)
Chloride: 106 mmol/L (ref 98–111)
Creatinine, Ser: 0.81 mg/dL (ref 0.44–1.00)
GFR, Estimated: >60 mL/min (ref >60)
Glucose, Bld: 102 mg/dL — ABNORMAL HIGH (ref 70–99)
Potassium: 3.9 mmol/L (ref 3.5–5.1)
Sodium: 141 mmol/L (ref 135–145)
Total Bilirubin: 0.7 mg/dL (ref 0.0–1.2)
Total Protein: 7.9 g/dL (ref 6.5–8.1)

## 2023-10-16 LAB — URINALYSIS, ROUTINE W REFLEX MICROSCOPIC
Bilirubin Urine: NEGATIVE
Glucose, UA: NEGATIVE mg/dL
Hgb urine dipstick: NEGATIVE
Ketones, ur: 5 mg/dL — AB
Nitrite: NEGATIVE
Protein, ur: 30 mg/dL — AB
Specific Gravity, Urine: 1.025 (ref 1.005–1.030)
Squamous Epithelial / HPF: >50 /HPF (ref 0–5)
pH: 6 (ref 5.0–8.0)

## 2023-10-16 LAB — LIPASE, BLOOD: Lipase: 48 U/L (ref 11–51)

## 2023-10-16 LAB — HCG, SERUM, QUALITATIVE: Preg, Serum: NEGATIVE

## 2023-10-16 MED ORDER — ONDANSETRON HCL 4 MG PO TABS: 4.0000 mg | ORAL_TABLET | Freq: Four times a day (QID) | ORAL | 0 refills | Status: AC

## 2023-10-16 MED ORDER — ONDANSETRON HCL 4 MG/2ML IJ SOLN
4.0000 mg | Freq: Once | INTRAMUSCULAR | Status: AC
  Administered 2023-10-16: 4 mg via INTRAVENOUS
  Filled 2023-10-16: qty 2

## 2023-10-16 MED ORDER — SODIUM CHLORIDE 0.9 % IV BOLUS
1000.0000 mL | Freq: Once | INTRAVENOUS | Status: AC
  Administered 2023-10-16: 1000 mL via INTRAVENOUS

## 2023-10-16 MED ORDER — CEPHALEXIN 500 MG PO CAPS: 500.0000 mg | ORAL_CAPSULE | Freq: Four times a day (QID) | ORAL | 0 refills | Status: AC

## 2023-10-16 MED ORDER — IOHEXOL 300 MG/ML  SOLN
100.0000 mL | Freq: Once | INTRAMUSCULAR | Status: AC | PRN
  Administered 2023-10-16: 100 mL via INTRAVENOUS

## 2023-10-16 NOTE — ED Triage Notes (Signed)
 Pt presents with c/o bilateral flank pain for 3 days. Pt denies any hematuria. Pt also reports insomnia.

## 2023-10-16 NOTE — Discharge Instructions (Signed)
 Was noted that he had a UTI today.  Please take the antibiotic 4 times daily until the full course is through.  I prescribed you some Zofran  to help with the nausea as well.  I will send your urine for culture and if they change and antibiotic need to be made you will be notified.  If symptoms do not resolve or if symptoms worsen return to ED or PCP for further evaluation.

## 2023-10-16 NOTE — ED Provider Notes (Signed)
 [place] EMERGENCY DEPARTMENT AT [facility] Provider Note   CSN: [id number] Arrival date & time: 10/16/23  9255     Patient presents with: Flank Pain and Insomnia   Stephanie Gamble is a 23 y.o. female.  23 year old female presents to the ED with complaints of bilateral lower back pain, diarrhea, abdominal pain, and weakness x 3 days.  Patient reports it has increased in severity.  Patient advises she has diarrhea episodes every few hours for the last 3 days.  Prior to that all bowel movements were consistent with no abnormalities.      Prior to Admission medications   Medication Sig Start Date End Date Taking? Authorizing Provider  cephALEXin  (KEFLEX ) 500 MG capsule Take 1 capsule (500 mg total) by mouth 4 (four) times daily. 10/16/23  Yes Myriam Fonda RAMAN, PA-C  ondansetron  (ZOFRAN ) 4 MG tablet Take 1 tablet (4 mg total) by mouth every 6 (six) hours. 10/16/23  Yes Myriam Fonda RAMAN, PA-C  acetaminophen  (TYLENOL ) 325 MG tablet Take 2 tablets (650 mg total) by mouth every 4 (four) hours as needed (for pain scale < 4). 12/21/22   Jhonny Augustin BROCKS, MD  escitalopram  (LEXAPRO ) 10 MG tablet Take 0.5 tablets (5 mg total) by mouth daily for 7 days, THEN 1 tablet (10 mg total) daily. 12/07/22 12/09/23  Erik Kieth BROCKS, MD  ibuprofen  (ADVIL ) 600 MG tablet Take 1 tablet (600 mg total) by mouth every 6 (six) hours. 12/21/22   Jhonny Augustin BROCKS, MD  Prenatal Vit-Fe Fumarate-FA (PRENATAL MULTIVITAMIN) TABS tablet Take 1 tablet by mouth daily at 12 noon. Patient not taking: Reported on 11/20/2022    [provider]  QUEtiapine  (SEROQUEL  XR) 50 MG TB24 24 hr tablet Take 1 tablet (50 mg total) by mouth at bedtime. 12/21/22   Jhonny Augustin BROCKS, MD  senna-docusate (SENOKOT-S) 8.6-50 MG tablet Take 2 tablets by mouth 2 (two) times daily as needed for mild constipation. 12/21/22   Jhonny Augustin BROCKS, MD    Allergies: Avocado, Banana, and Other    Review of Systems  Gastrointestinal:  Positive  for abdominal pain, diarrhea and nausea.  Genitourinary:  Positive for flank pain.  Neurological:  Positive for weakness.  All other systems reviewed and are negative.   Updated Vital Signs BP 119/74 (BP Location: Left Arm)   Pulse 60   Temp 99.4 F (37.4 C) (Oral)   Resp 18   LMP 09/03/2023 (Approximate)   SpO2 99%   Physical Exam Vitals and nursing note reviewed.  Constitutional:      Appearance: Normal appearance.  HENT:     Head: Normocephalic and atraumatic.     Nose: Nose normal.  Eyes:     Extraocular Movements: Extraocular movements intact.     Conjunctiva/sclera: Conjunctivae normal.     Pupils: Pupils are equal, round, and reactive to light.  Cardiovascular:     Rate and Rhythm: Normal rate.  Pulmonary:     Effort: Pulmonary effort is normal. No respiratory distress.     Breath sounds: Normal breath sounds.  Abdominal:     General: Abdomen is flat. Bowel sounds are normal.     Palpations: Abdomen is soft.     Tenderness: There is generalized abdominal tenderness. There is right CVA tenderness and left CVA tenderness. There is no guarding.  Musculoskeletal:        General: Normal range of motion.     Cervical back: Normal range of motion.  Skin:    General: Skin is  warm.     Capillary Refill: Capillary refill takes less than 2 seconds.  Neurological:     General: No focal deficit present.     Mental Status: She is alert.  Psychiatric:        Mood and Affect: Mood normal.        Behavior: Behavior normal.     (all labs ordered are listed, but only abnormal results are displayed) Labs Reviewed  URINALYSIS, ROUTINE W REFLEX MICROSCOPIC - Abnormal; Notable for the following components:      Result Value   Color, Urine AMBER (*)    APPearance CLOUDY (*)    Ketones, ur 5 (*)    Protein, ur 30 (*)    Leukocytes,Ua MODERATE (*)    Bacteria, UA FEW (*)    All other components within normal limits  CBC WITH DIFFERENTIAL/PLATELET - Abnormal; Notable for the  following components:   RBC 6.03 (*)    MCV 74.3 (*)    MCH 23.1 (*)    RDW 21.2 (*)    Platelets 416 (*)    All other components within normal limits  COMPREHENSIVE METABOLIC PANEL WITH GFR - Abnormal; Notable for the following components:   CO2 19 (*)    Glucose, Bld 102 (*)    AST 76 (*)    ALT 198 (*)    Anion gap 16 (*)    All other components within normal limits  URINE CULTURE  HCG, SERUM, QUALITATIVE  LIPASE, BLOOD    EKG: None  Radiology: CT ABDOMEN PELVIS W CONTRAST Result Date: 10/16/2023 CLINICAL DATA:  Abdominal pain.  Acute abdominal pain. EXAM: CT ABDOMEN AND PELVIS WITH CONTRAST TECHNIQUE: Multidetector CT imaging of the abdomen and pelvis was performed using the standard protocol following bolus administration of intravenous contrast. RADIATION DOSE REDUCTION: This exam was performed according to the departmental dose-optimization program which includes automated exposure control, adjustment of the mA and/or kV according to patient size and/or use of iterative reconstruction technique. CONTRAST:  OMNIPAQUE  IOHEXOL  300 MG/ML  SOLN COMPARISON:  None Available. FINDINGS: Lower chest: Lung bases are clear. Hepatobiliary: No focal hepatic lesion. Normal gallbladder. No biliary duct dilatation. Common bile duct is normal. Pancreas: Pancreas is normal. No ductal dilatation. No pancreatic inflammation. Spleen: Normal spleen Adrenals/urinary tract: Adrenal glands normal. Nonobstructing small 3 mm calculus in the mid RIGHT kidney. No ureterolithiasis or obstructive uropathy. No bladder calculi. Stomach/Bowel: Stomach, small bowel, appendix, and cecum are normal. The colon and rectosigmoid colon are normal. Vascular/Lymphatic: Abdominal aorta is normal caliber. No periportal or retroperitoneal adenopathy. No pelvic adenopathy. Reproductive: Uterus and adnexa unremarkable. Other: No free fluid. Musculoskeletal: No aggressive osseous lesion. IMPRESSION: 1. No acute findings in the  abdomen pelvis. 2. Normal appendix. 3. Nonobstructing RIGHT renal calculus. Electronically Signed   By: Jackquline Boxer M.D.   On: 10/16/2023 11:04     Procedures   Medications Ordered in the ED  sodium chloride  0.9 % bolus 1,000 mL (0 mLs Intravenous Stopped 10/16/23 1200)  ondansetron  (ZOFRAN ) injection 4 mg (4 mg Intravenous Given 10/16/23 0840)  iohexol  (OMNIPAQUE ) 300 MG/ML solution 100 mL (100 mLs Intravenous Contrast Given 10/16/23 1016)   23 y.o. female presents to the ED with complaints of bilateral flank pain, weakness, diarrhea, abdominal pain x 3 days., this involves an extensive number of treatment options, and is a complaint that carries with it a high risk of complications and morbidity.  The differential diagnosis includes pyelonephritis, nephrolithiasis, UTI, appendicitis, cholecystitis, colitis, gastroenteritis, (  Ddx)  On arrival pt is nontoxic, vitals unremarkable. Exam significant for bilateral CVA tenderness, generalized abdominal tenderness, worsening tenderness suprapubic, LLQ, and right upper quadrant.  Additional history obtained from chart review which was significant for admission 11/2022 for birth of second child.  Patient has been placed on Nexplanon  since.  I ordered medication Zofran  for nausea  Lab Tests:  I Ordered, reviewed, and interpreted labs, which included: CMP, CBC, Lipase, HCG, UA, Urine culture.   Imaging Studies ordered:  I ordered imaging studies which included CT abdomen pelvis with contrast, I independently visualized and interpreted imaging which showed no acute findings.   ED Course:   Patient sitting comfortably in ED bed upon initial evaluation.  Patient has complaints of 3 days of bilateral flank pain, weakness, diarrhea, abdominal pain.  Patient also reports insomnia for several months.  Abdomen tender in all quadrants, negative Murphy's/McBurney's.  Patient has more significant tenderness in the suprapubic left lower quadrant and right  upper quadrant.  Patient has bilateral CVA tenderness.  Patient has lungs clear to auscultation in all fields.  No rashes noted on abdomen or back.  Patient denies any discharge, foul odors to urine, or dysuria.  Patient has some associated bladder fullness.  Patient denies fever, cough, headache, dizziness, shortness of breath, chest pain.  Initial plan is to get labs and CT to rule out abdominal etiology.  Urine was suggestive of infection and will be sent for culture. CT was unremarkable. Patient is sitting comfortably in ED with no new symptoms or complaints. Patient reports feeling better after zofran  and fluids. Patient will be treated for UTI and given short course of zofran  for nausea. Patient was advised to return for worsening symptoms and agreed to treatment plan.     Portions of this note were generated with Scientist, clinical (histocompatibility and immunogenetics). Dictation errors may occur despite best attempts at proofreading.    Final diagnoses:  Acute cystitis without hematuria    ED Discharge Orders          Ordered    cephALEXin  (KEFLEX ) 500 MG capsule  4 times daily        10/16/23 1147    ondansetron  (ZOFRAN ) 4 MG tablet  Every 6 hours        10/16/23 1147               Myriam Fonda GORMAN DEVONNA 10/17/23 1003    Stephanie Folks, MD 10/19/23 2324

## 2023-10-18 LAB — URINE CULTURE: Culture: >=100000 — AB
